# Patient Record
Sex: Male | Born: 1940 | Race: White | Hispanic: No | Marital: Married | Smoking: Never smoker
Health system: Southern US, Community
[De-identification: ages and names within clinical notes are randomized; demographics above are authoritative.]

## PROBLEM LIST (undated history)

## (undated) DIAGNOSIS — E039 Hypothyroidism, unspecified: Secondary | ICD-10-CM

## (undated) DIAGNOSIS — I495 Sick sinus syndrome: Secondary | ICD-10-CM

## (undated) DIAGNOSIS — I480 Paroxysmal atrial fibrillation: Secondary | ICD-10-CM

## (undated) DIAGNOSIS — Z950 Presence of cardiac pacemaker: Secondary | ICD-10-CM

## (undated) DIAGNOSIS — Z9049 Acquired absence of other specified parts of digestive tract: Secondary | ICD-10-CM

## (undated) HISTORY — PX: APPENDECTOMY: SHX54

## (undated) HISTORY — DX: Presence of cardiac pacemaker: Z95.0

## (undated) HISTORY — DX: Acquired absence of other specified parts of digestive tract: Z90.49

## (undated) HISTORY — DX: Sick sinus syndrome: I49.5

## (undated) HISTORY — PX: TONSILLECTOMY: SUR1361

## (undated) HISTORY — PX: ACHILLES TENDON REPAIR: SUR1153

## (undated) HISTORY — PX: COLON SURGERY: SHX602

## (undated) HISTORY — DX: Paroxysmal atrial fibrillation: I48.0

## (undated) HISTORY — PX: PACEMAKER IMPLANT: EP1218

---

## 2019-08-18 ENCOUNTER — Emergency Department (HOSPITAL_COMMUNITY): Payer: Medicare Other

## 2019-08-18 ENCOUNTER — Emergency Department (HOSPITAL_COMMUNITY)
Admission: EM | Admit: 2019-08-18 | Discharge: 2019-08-18 | Disposition: A | Discharge status: Home / Self Care | Payer: Medicare Other | Attending: Emergency Medicine | Admitting: Emergency Medicine

## 2019-08-18 ENCOUNTER — Other Ambulatory Visit: Payer: Self-pay

## 2019-08-18 ENCOUNTER — Encounter (HOSPITAL_COMMUNITY): Payer: Self-pay | Admitting: *Deleted

## 2019-08-18 DIAGNOSIS — I480 Paroxysmal atrial fibrillation: Secondary | ICD-10-CM | POA: Diagnosis not present

## 2019-08-18 DIAGNOSIS — R0602 Shortness of breath: Secondary | ICD-10-CM | POA: Diagnosis present

## 2019-08-18 DIAGNOSIS — U071 COVID-19: Secondary | ICD-10-CM

## 2019-08-18 DIAGNOSIS — E039 Hypothyroidism, unspecified: Secondary | ICD-10-CM | POA: Diagnosis not present

## 2019-08-18 DIAGNOSIS — E860 Dehydration: Secondary | ICD-10-CM

## 2019-08-18 HISTORY — DX: Hypothyroidism, unspecified: E03.9

## 2019-08-18 LAB — CBC WITH DIFFERENTIAL/PLATELET
Abs Immature Granulocytes: 0.02 10*3/uL (ref 0.00–0.07)
Basophils Absolute: 0 10*3/uL (ref 0.0–0.1)
Basophils Relative: 0 %
Eosinophils Absolute: 0 10*3/uL (ref 0.0–0.5)
Eosinophils Relative: 0 %
HCT: 52.6 % — ABNORMAL HIGH (ref 39.0–52.0)
Hemoglobin: 17.6 g/dL — ABNORMAL HIGH (ref 13.0–17.0)
Immature Granulocytes: 0 %
Lymphocytes Relative: 17 %
Lymphs Abs: 1.1 10*3/uL (ref 0.7–4.0)
MCH: 30.8 pg (ref 26.0–34.0)
MCHC: 33.5 g/dL (ref 30.0–36.0)
MCV: 92 fL (ref 80.0–100.0)
Monocytes Absolute: 0.8 10*3/uL (ref 0.1–1.0)
Monocytes Relative: 12 %
Neutro Abs: 4.5 10*3/uL (ref 1.7–7.7)
Neutrophils Relative %: 71 %
Platelets: 127 10*3/uL — ABNORMAL LOW (ref 150–400)
RBC: 5.72 MIL/uL (ref 4.22–5.81)
RDW: 12.2 % (ref 11.5–15.5)
WBC: 6.3 10*3/uL (ref 4.0–10.5)
nRBC: 0 % (ref 0.0–0.2)

## 2019-08-18 LAB — COMPREHENSIVE METABOLIC PANEL
ALT: 23 U/L (ref 0–44)
AST: 29 U/L (ref 15–41)
Albumin: 4.4 g/dL (ref 3.5–5.0)
Alkaline Phosphatase: 67 U/L (ref 38–126)
Anion gap: 11 (ref 5–15)
BUN: 24 mg/dL — ABNORMAL HIGH (ref 8–23)
CO2: 24 mmol/L (ref 22–32)
Calcium: 8.8 mg/dL — ABNORMAL LOW (ref 8.9–10.3)
Chloride: 97 mmol/L — ABNORMAL LOW (ref 98–111)
Creatinine, Ser: 1.54 mg/dL — ABNORMAL HIGH (ref 0.61–1.24)
GFR calc Af Amer: 50 mL/min — ABNORMAL LOW (ref >60)
GFR calc non Af Amer: 43 mL/min — ABNORMAL LOW (ref >60)
Glucose, Bld: 119 mg/dL — ABNORMAL HIGH (ref 70–99)
Potassium: 4.5 mmol/L (ref 3.5–5.1)
Sodium: 132 mmol/L — ABNORMAL LOW (ref 135–145)
Total Bilirubin: 1.3 mg/dL — ABNORMAL HIGH (ref 0.3–1.2)
Total Protein: 8.5 g/dL — ABNORMAL HIGH (ref 6.5–8.1)

## 2019-08-18 LAB — POC SARS CORONAVIRUS 2 AG -  ED: SARS Coronavirus 2 Ag: POSITIVE — AB

## 2019-08-18 LAB — BRAIN NATRIURETIC PEPTIDE: B Natriuretic Peptide: 378 pg/mL — ABNORMAL HIGH (ref 0.0–100.0)

## 2019-08-18 LAB — TROPONIN I (HIGH SENSITIVITY)
Troponin I (High Sensitivity): 5 ng/L (ref <18)
Troponin I (High Sensitivity): 6 ng/L (ref <18)

## 2019-08-18 MED ORDER — SODIUM CHLORIDE 0.9 % IV BOLUS
1000.0000 mL | Freq: Once | INTRAVENOUS | Status: AC
  Administered 2019-08-18: 1000 mL via INTRAVENOUS

## 2019-08-18 MED ORDER — ALBUTEROL SULFATE (2.5 MG/3ML) 0.083% IN NEBU: 5.0000 mg | INHALATION_SOLUTION | Freq: Once | RESPIRATORY_TRACT | Status: DC

## 2019-08-18 MED ORDER — SODIUM CHLORIDE 0.9 % IV BOLUS
500.0000 mL | Freq: Once | INTRAVENOUS | Status: AC
  Administered 2019-08-18: 500 mL via INTRAVENOUS

## 2019-08-18 NOTE — Discharge Instructions (Addendum)
Continue medications as previously prescribed.  Drink plenty of fluids and get plenty of rest.  Isolate yourself and remain at home until symptoms resolve +1-week.  Return to the emergency department if you develop worsening breathing, severe chest pain, or other new and concerning symptoms.       Infection Prevention Recommendations for Individuals Confirmed to have, or Being Evaluated for, 2019 Novel Coronavirus (COVID-19) Infection Who Receive Care at Home  Individuals who are confirmed to have, or are being evaluated for, COVID-19 should follow the prevention steps below until a healthcare provider or local or state health department says they can return to normal activities.  Stay home except to get medical care You should restrict activities outside your home, except for getting medical care. Do not go to work, school, or public areas, and do not use public transportation or taxis.  Call ahead before visiting your doctor Before your medical appointment, call the healthcare provider and tell them that you have, or are being evaluated for, COVID-19 infection. This will help the healthcare providers office take steps to keep other people from getting infected. Ask your healthcare provider to call the local or state health department.  Monitor your symptoms Seek prompt medical attention if your illness is worsening (e.g., difficulty breathing). Before going to your medical appointment, call the healthcare provider and tell them that you have, or are being evaluated for, COVID-19 infection. Ask your healthcare provider to call the local or state health department.  Wear a facemask You should wear a facemask that covers your nose and mouth when you are in the same room with other people and when you visit a healthcare provider. People who live with or visit you should also wear a facemask while they are in the same room with you.  Separate yourself from other people in your  home As much as possible, you should stay in a different room from other people in your home. Also, you should use a separate bathroom, if available.  Avoid sharing household items You should not share dishes, drinking glasses, cups, eating utensils, towels, bedding, or other items with other people in your home. After using these items, you should wash them thoroughly with soap and water.  Cover your coughs and sneezes Cover your mouth and nose with a tissue when you cough or sneeze, or you can cough or sneeze into your sleeve. Throw used tissues in a lined trash can, and immediately wash your hands with soap and water for at least 20 seconds or use an alcohol-based hand rub.  Wash your [facility] your hands often and thoroughly with soap and water for at least 20 seconds. You can use an alcohol-based hand sanitizer if soap and water are not available and if your hands are not visibly dirty. Avoid touching your eyes, nose, and mouth with unwashed hands.   Prevention Steps for Caregivers and Household Members of Individuals Confirmed to have, or Being Evaluated for, COVID-19 Infection Being Cared for in the Home  If you live with, or provide care at home for, a person confirmed to have, or being evaluated for, COVID-19 infection please follow these guidelines to prevent infection:  Follow healthcare providers instructions Make sure that you understand and can help the patient follow any healthcare provider instructions for all care.  Provide for the patients basic needs You should help the patient with basic needs in the home and provide support for getting groceries, prescriptions, and other personal needs.  Monitor the patients  symptoms If they are getting sicker, call his or her medical provider and tell them that the patient has, or is being evaluated for, COVID-19 infection. This will help the healthcare providers office take steps to keep other people from getting  infected. Ask the healthcare provider to call the local or state health department.  Limit the number of people who have contact with the patient If possible, have only one caregiver for the patient. Other household members should stay in another home or place of residence. If this is not possible, they should stay in another room, or be separated from the patient as much as possible. Use a separate bathroom, if available. Restrict visitors who do not have an essential need to be in the home.  Keep older adults, very young children, and other sick people away from the patient Keep older adults, very young children, and those who have compromised immune systems or chronic health conditions away from the patient. This includes people with chronic heart, lung, or kidney conditions, diabetes, and cancer.  Ensure good ventilation Make sure that shared spaces in the home have good air flow, such as from an air conditioner or an opened window, weather permitting.  Wash your hands often Wash your hands often and thoroughly with soap and water for at least 20 seconds. You can use an alcohol based hand sanitizer if soap and water are not available and if your hands are not visibly dirty. Avoid touching your eyes, nose, and mouth with unwashed hands. Use disposable paper towels to dry your hands. If not available, use dedicated cloth towels and replace them when they become wet.  Wear a facemask and gloves Wear a disposable facemask at all times in the room and gloves when you touch or have contact with the patients blood, body fluids, and/or secretions or excretions, such as sweat, saliva, sputum, nasal mucus, vomit, urine, or feces.  Ensure the mask fits over your nose and mouth tightly, and do not touch it during use. Throw out disposable facemasks and gloves after using them. Do not reuse. Wash your hands immediately after removing your facemask and gloves. If your personal clothing becomes  contaminated, carefully remove clothing and launder. Wash your hands after handling contaminated clothing. Place all used disposable facemasks, gloves, and other waste in a lined container before disposing them with other household waste. Remove gloves and wash your hands immediately after handling these items.  Do not share dishes, glasses, or other household items with the patient Avoid sharing household items. You should not share dishes, drinking glasses, cups, eating utensils, towels, bedding, or other items with a patient who is confirmed to have, or being evaluated for, COVID-19 infection. After the person uses these items, you should wash them thoroughly with soap and water.  Wash laundry thoroughly Immediately remove and wash clothes or bedding that have blood, body fluids, and/or secretions or excretions, such as sweat, saliva, sputum, nasal mucus, vomit, urine, or feces, on them. Wear gloves when handling laundry from the patient. Read and follow directions on labels of laundry or clothing items and detergent. In general, wash and dry with the warmest temperatures recommended on the label.  Clean all areas the individual has used often Clean all touchable surfaces, such as counters, tabletops, doorknobs, bathroom fixtures, toilets, phones, keyboards, tablets, and bedside tables, every day. Also, clean any surfaces that may have blood, body fluids, and/or secretions or excretions on them. Wear gloves when cleaning surfaces the patient has come in contact  with. Use a diluted bleach solution (e.g., dilute bleach with 1 part bleach and 10 parts water) or a household disinfectant with a label that says EPA-registered for coronaviruses. To make a bleach solution at home, add 1 tablespoon of bleach to 1 quart (4 cups) of water. For a larger supply, add  cup of bleach to 1 gallon (16 cups) of water. Read labels of cleaning products and follow recommendations provided on product labels. Labels  contain instructions for safe and effective use of the cleaning product including precautions you should take when applying the product, such as wearing gloves or eye protection and making sure you have good ventilation during use of the product. Remove gloves and wash hands immediately after cleaning.  Monitor yourself for signs and symptoms of illness Caregivers and household members are considered close contacts, should monitor their health, and will be asked to limit movement outside of the home to the extent possible. Follow the monitoring steps for close contacts listed on the symptom monitoring form.   ? If you have additional questions, contact your local health department or call the epidemiologist on call at [phone] (available 24/7). ? This guidance is subject to change. For the most up-to-date guidance from [facility], please refer to their website: [email or web address]

## 2019-08-18 NOTE — ED Notes (Signed)
Date and time results received: 08/18/19 1945 (use smartphrase ".now" to insert current time)  Test: POC covid Critical Value: positive  Name of Provider Notified: Dr Stark Jock  Orders Received? Or Actions Taken?: Actions Taken: no orders received

## 2019-08-18 NOTE — ED Triage Notes (Signed)
C/o shortness of breath, loss of appetite and weakness onset 1 week ago, thinks he may have covid

## 2019-08-18 NOTE — ED Notes (Signed)
Walk in room 02 97% room air

## 2019-08-18 NOTE — ED Provider Notes (Signed)
[facility] EMERGENCY DEPARTMENT Provider Note   CSN: [id number] Arrival date & time: 08/18/19  1806     History Chief Complaint  Patient presents with  . Shortness of Breath    Antonio Curtis is a 78 y.o. male.  Patient is a 78 year old male with past medical history of atrial fibrillation on Eliquis, sick sinus with pacemaker placement, hypothyroidism.  He presents today for evaluation of shortness of breath.  Patient reports weakness and fatigue worsening over the past week, then became dyspneic this evening with ambulation.  He does report some cough, but no fevers or chills.  Patient does report some palpitations.  The history is provided by the patient.  Shortness of Breath Severity:  Moderate Onset quality:  Gradual Duration:  1 week Timing:  Constant Progression:  Worsening Chronicity:  New Context: activity and URI   Relieved by:  Nothing Worsened by:  Activity Ineffective treatments:  None tried Associated symptoms: cough   Associated symptoms: no chest pain and no fever        Past Medical History:  Diagnosis Date  . A-fib ([place])   . Hypothyroid     There are no problems to display for this patient.   Past Surgical History:  Procedure Laterality Date  . APPENDECTOMY    . COLON SURGERY    . TONSILLECTOMY         No family history on file.  Social History   Tobacco Use  . Smoking status: Never Smoker  . Smokeless tobacco: Never Used  Substance Use Topics  . Alcohol use: Never  . Drug use: Not Currently    Home Medications Prior to Admission medications   Not on File    Allergies    Patient has no known allergies.  Review of Systems   Review of Systems  Constitutional: Negative for fever.  Respiratory: Positive for cough and shortness of breath.   Cardiovascular: Negative for chest pain.  All other systems reviewed and are negative.   Physical Exam Updated Vital Signs BP 92/69 (BP Location: Right Arm)   Pulse (!) 105    Temp 97.8 F (36.6 C) (Oral)   Resp 18   Ht 6' (1.829 m)   SpO2 98%   Physical Exam Vitals and nursing note reviewed.  Constitutional:      General: He is not in acute distress.    Appearance: He is well-developed. He is not diaphoretic.  HENT:     Head: Normocephalic and atraumatic.  Cardiovascular:     Rate and Rhythm: Tachycardia present. Rhythm irregular.     Heart sounds: No murmur. No friction rub.  Pulmonary:     Effort: Pulmonary effort is normal. No respiratory distress.     Breath sounds: Normal breath sounds. No wheezing or rales.  Abdominal:     General: Bowel sounds are normal. There is no distension.     Palpations: Abdomen is soft.     Tenderness: There is no abdominal tenderness.  Musculoskeletal:        General: Normal range of motion.     Cervical back: Normal range of motion and neck supple.     Right lower leg: No tenderness. No edema.     Left lower leg: No tenderness. No edema.  Skin:    General: Skin is warm and dry.  Neurological:     Mental Status: He is alert and oriented to person, place, and time.     Coordination: Coordination normal.     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  Procedures Procedures (including critical care time)  Medications Ordered in  ED Medications  albuterol (PROVENTIL) (2.5 MG/3ML) 0.083% nebulizer solution 5 mg (5 mg Nebulization Not Given 08/18/19 1826)  sodium chloride 0.9 % bolus 1,000 mL (has no administration in time range)    ED Course  I have reviewed the triage vital signs and the nursing notes.  Pertinent labs & imaging results that were available during my care of the patient were reviewed by me and considered in my medical decision making (see chart for details).  Patient is a 78 year old male with history of paroxysmal A. fib, pacemaker placement, and hypothyroidism.  He presents today for evaluation of weakness, shortness of breath, and fatigue.  This is worsened over the past week.  Patient's son is a hospitalist who checked on him.  His oxygen saturations were borderline low and brings him here for evaluation.  Initial blood pressure is somewhat soft with systolic in the 90s, but no fever or white count.  Patient hydrated with normal saline with some improvement.  He is also in atrial fibrillation with controlled ventricular rate.  This is not uncommon for him and he tells me this comes and goes.  He is currently taking flecainide and metoprolol.  Work-up shows a positive Covid test.  His chest x-ray is clear and oxygen saturations have been 95% or greater throughout his emergency department stay.  Electrolytes are reflective of some degree of dehydration for which he was given 1500 cc of normal saline.  Patient ambulated in his exam room and tolerated this well.   At this point, the disposition was discussed with the patient and son.  They are both comfortable with him returning home.  He is not hypoxic and appears clinically well.  He will be discharged to home, to return as needed if he develops worsening breathing, chest pains, or other new and concerning symptoms.  MDM Rules/Calculators/A&P  Final Clinical Impression(s) / ED Diagnoses Final diagnoses:  None    Rx / DC Orders ED Discharge Orders     None       Geoffery Lyons, MD 08/18/19 2242

## 2019-08-19 ENCOUNTER — Telehealth (HOSPITAL_COMMUNITY): Payer: Self-pay | Admitting: Critical Care Medicine

## 2019-08-19 ENCOUNTER — Ambulatory Visit (HOSPITAL_COMMUNITY)
Admission: RE | Admit: 2019-08-19 | Discharge: 2019-08-19 | Disposition: A | Discharge status: Home / Self Care | Payer: Medicare Other | Source: Ambulatory Visit | Attending: Pulmonary Disease | Admitting: Pulmonary Disease

## 2019-08-19 ENCOUNTER — Other Ambulatory Visit (HOSPITAL_COMMUNITY): Payer: Self-pay | Admitting: Critical Care Medicine

## 2019-08-19 DIAGNOSIS — I4811 Longstanding persistent atrial fibrillation: Secondary | ICD-10-CM | POA: Insufficient documentation

## 2019-08-19 DIAGNOSIS — U071 COVID-19: Secondary | ICD-10-CM | POA: Insufficient documentation

## 2019-08-19 DIAGNOSIS — Z23 Encounter for immunization: Secondary | ICD-10-CM | POA: Diagnosis not present

## 2019-08-19 MED ORDER — ALBUTEROL SULFATE HFA 108 (90 BASE) MCG/ACT IN AERS: 2.0000 | INHALATION_SPRAY | Freq: Once | RESPIRATORY_TRACT | Status: DC | PRN

## 2019-08-19 MED ORDER — SODIUM CHLORIDE 0.9 % IV SOLN
Freq: Once | INTRAVENOUS | Status: AC
  Filled 2019-08-19: qty 10

## 2019-08-19 MED ORDER — METHYLPREDNISOLONE SODIUM SUCC 125 MG IJ SOLR: 125.0000 mg | Freq: Once | INTRAMUSCULAR | Status: DC | PRN

## 2019-08-19 MED ORDER — FAMOTIDINE IN NACL 20-0.9 MG/50ML-% IV SOLN: 20.0000 mg | Freq: Once | INTRAVENOUS | Status: DC | PRN

## 2019-08-19 MED ORDER — EPINEPHRINE 0.3 MG/0.3ML IJ SOAJ: 0.3000 mg | Freq: Once | INTRAMUSCULAR | Status: DC | PRN

## 2019-08-19 MED ORDER — DIPHENHYDRAMINE HCL 50 MG/ML IJ SOLN: 50.0000 mg | Freq: Once | INTRAMUSCULAR | Status: DC | PRN

## 2019-08-19 MED ORDER — SODIUM CHLORIDE 0.9 % IV SOLN
INTRAVENOUS | Status: DC | PRN
  Administered 2019-08-19: 13:00:00 250 mL via INTRAVENOUS

## 2019-08-19 NOTE — Telephone Encounter (Signed)
I connected with this patient who is Covid positive and actually was in the emergency room last night with dehydration and rapid heart rate from his chronic atrial fibrillation and volume depletion.  The patient has cardiovascular disease and is over 78 years of age.  The patient today is having some cough shortness of breath severe fatigue body aches but is improved somewhat from getting fluid hydration last night in the emergency room  The patient has agreed to monoclonal antibody infusion  Called to discuss with patient about Covid symptoms and the use of  casirivimab\imdevimab. , a monoclonal antibody infusion for those with mild to moderate Covid symptoms and at a high risk of hospitalization.  Pt is qualified for this infusion at the [facility] infusion center due to Age > 65, cardiovascular disease: chronic atrial fibrillation

## 2019-08-19 NOTE — Discharge Instructions (Signed)
Prevent the Spread of COVID-19 if You Are Sick If you are sick with COVID-19 or think you might have COVID-19, follow the steps below to help protect other people in your home and community. Stay home except to get medical care.  Stay home. Most people with COVID-19 have mild illness and are able to recover at home without medical care. Do not leave your home, except to get medical care. Do not visit public areas.  Take care of yourself. Get rest and stay hydrated.  Get medical care when needed. Call your doctor before you go to their office for care. But, if you have trouble breathing or other concerning symptoms, call 911 for immediate help.  Avoid public transportation, ride-sharing, or taxis. Separate yourself from other people and pets in your home.  As much as possible, stay in a specific room and away from other people and pets in your home. Also, you should use a separate bathroom, if available. If you need to be around other people or animals in or outside of the home, wear a cloth face covering. ? See COVID-19 and Animals if you have questions about pets: https://www.cdc.gov/coronavirus/2019-ncov/faq.html#COVID19animals Monitor your symptoms.  Common symptoms of COVID-19 include fever and cough. Trouble breathing is a more serious symptom that means you should get medical attention.  Follow care instructions from your healthcare provider and local health department. Your local health authorities will give instructions on checking your symptoms and reporting information. If you develop emergency warning signs for COVID-19 get medical attention immediately.  Emergency warning signs include*:  Trouble breathing  Persistent pain or pressure in the chest  New confusion or not able to be woken  Bluish lips or face *This list is not all inclusive. Please consult your medical provider for any other symptoms that are severe or concerning to you. Call 911 if you have a medical  emergency. If you have a medical emergency and need to call 911, notify the operator that you have or think you might have, COVID-19. If possible, put on a facemask before medical help arrives. Call ahead before visiting your doctor.  Call ahead. Many medical visits for routine care are being postponed or done by phone or telemedicine.  If you have a medical appointment that cannot be postponed, call your doctor's office. This will help the office protect themselves and other patients. If you are sick, wear a cloth covering over your nose and mouth.  You should wear a cloth face covering over your nose and mouth if you must be around other people or animals, including pets (even at home).  You don't need to wear the cloth face covering if you are alone. If you can't put on a cloth face covering (because of trouble breathing for example), cover your coughs and sneezes in some other way. Try to stay at least 6 feet away from other people. This will help protect the people around you. Note: During the COVID-19 pandemic, medical grade facemasks are reserved for healthcare workers and some first responders. You may need to make a cloth face covering using a scarf or bandana. Cover your coughs and sneezes.  Cover your mouth and nose with a tissue when you cough or sneeze.  Throw used tissues in a lined trash can.  Immediately wash your hands with soap and water for at least 20 seconds. If soap and water are not available, clean your hands with an alcohol-based hand sanitizer that contains at least 60% alcohol. Clean your hands often.    Wash your hands often with soap and water for at least 20 seconds. This is especially important after blowing your nose, coughing, or sneezing; going to the bathroom; and before eating or preparing food.  Use hand sanitizer if soap and water are not available. Use an alcohol-based hand sanitizer with at least 60% alcohol, covering all surfaces of your hands and rubbing  them together until they feel dry.  Soap and water are the best option, especially if your hands are visibly dirty.  Avoid touching your eyes, nose, and mouth with unwashed hands. Avoid sharing personal household items.  Do not share dishes, drinking glasses, cups, eating utensils, towels, or bedding with other people in your home.  Wash these items thoroughly after using them with soap and water or put them in the dishwasher. Clean all "high-touch" surfaces everyday.  Clean and disinfect high-touch surfaces in your "sick room" and bathroom. Let someone else clean and disinfect surfaces in common areas, but not your bedroom and bathroom.  If a caregiver or other person needs to clean and disinfect a sick person's bedroom or bathroom, they should do so on an as-needed basis. The caregiver/other person should wear a mask and wait as long as possible after the sick person has used the bathroom. High-touch surfaces include phones, remote controls, counters, tabletops, doorknobs, bathroom fixtures, toilets, keyboards, tablets, and bedside tables.  Clean and disinfect areas that may have blood, stool, or body fluids on them.  Use household cleaners and disinfectants. Clean the area or item with soap and water or another detergent if it is dirty. Then use a household disinfectant. ? Be sure to follow the instructions on the label to ensure safe and effective use of the product. Many products recommend keeping the surface wet for several minutes to ensure germs are killed. Many also recommend precautions such as wearing gloves and making sure you have good ventilation during use of the product. ? Most EPA-registered household disinfectants should be effective. How to discontinue home isolation  People with COVID-19 who have stayed home (home isolated) can stop home isolation under the following conditions: ? If you will not have a test to determine if you are still contagious, you can leave home  after these three things have happened:  You have had no fever for at least 72 hours (that is three full days of no fever without the use of medicine that reduces fevers) AND  other symptoms have improved (for example, when your cough or shortness of breath has improved) AND  at least 10 days have passed since your symptoms first appeared. ? If you will be tested to determine if you are still contagious, you can leave home after these three things have happened:  You no longer have a fever (without the use of medicine that reduces fevers) AND  other symptoms have improved (for example, when your cough or shortness of breath has improved) AND  you received two negative tests in a row, 24 hours apart. Your doctor will follow CDC guidelines. In all cases, follow the guidance of your healthcare provider and local health department. The decision to stop home isolation should be made in consultation with your healthcare provider and state and local health departments. Local decisions depend on local circumstances. cdc.gov/coronavirus 01/04/2019 This information is not intended to replace advice given to you by your health care provider. Make sure you discuss any questions you have with your health care provider. Document Released: 12/16/2018 Document Revised: 01/14/2019 Document Reviewed: 12/16/2018   Elsevier Patient Education  2020 [employer]  

## 2019-08-19 NOTE — Progress Notes (Signed)
  Diagnosis: COVID-19  Physician: Dr. Joya Gaskins  Procedure: Covid Infusion Clinic Med: casirivimab\imdevimab infusion - Provided patient with casirivimab\imdevimab fact sheet for patients, parents and caregivers prior to infusion.  Complications: No immediate complications noted.  Discharge: Discharged home   Tai, Hermelinda Medicus 08/19/2019

## 2019-08-19 NOTE — Progress Notes (Signed)
  Diagnosis: COVID-19  Physician:Wright   Procedure: Covid Infusion Clinic Med: casirivimab\imdevimab infusion - Provided patient with casirivimab\imdevimab fact sheet for patients, parents and caregivers prior to infusion.  Complications: No immediate complications noted.  Discharge: Discharged home   Baxter Hire 08/19/2019

## 2019-08-19 NOTE — Progress Notes (Signed)
  I connected by phone with [name] on 08/19/2019 at 7:34 AM to discuss the potential use of an new treatment for mild to moderate COVID-19 viral infection in non-hospitalized patients.  This patient is a 78 y.o. male that meets the FDA criteria for Emergency Use Authorization of bamlanivimab or casirivimab\imdevimab.  Has a (+) direct SARS-CoV-2 viral test result  Has mild or moderate COVID-19   Is ? 76 years of age and weighs ? 40 kg  Is NOT hospitalized due to COVID-19  Is NOT requiring oxygen therapy or requiring an increase in baseline oxygen flow rate due to COVID-19  Is within 10 days of symptom onset  Has at least one of the high risk factor(s) for progression to severe COVID-19 and/or hospitalization as defined in EUA.  Specific high risk criteria : Cardiovascular Disease Age> 65   I have spoken and communicated the following to the patient or parent/caregiver:  1. FDA has authorized the emergency use of bamlanivimab and casirivimab\imdevimab for the treatment of mild to moderate COVID-19 in adults and pediatric patients with positive results of direct SARS-CoV-2 viral testing who are 13 years of age and older weighing at least 40 kg, and who are at high risk for progressing to severe COVID-19 and/or hospitalization.  2. The significant known and potential risks and benefits of bamlanivimab and casirivimab\imdevimab, and the extent to which such potential risks and benefits are unknown.  3. Information on available alternative treatments and the risks and benefits of those alternatives, including clinical trials.  4. Patients treated with bamlanivimab and casirivimab\imdevimab should continue to self-isolate and use infection control measures (e.g., wear mask, isolate, social distance, avoid sharing personal items, clean and disinfect "high touch" surfaces, and frequent handwashing) according to CDC guidelines.   5. The patient or parent/caregiver has the option to  accept or refuse bamlanivimab or casirivimab\imdevimab .  After reviewing this information with the patient, The patient agreed to proceed with receiving the casirivimab\imdevimab infusion and will be provided a copy of the Fact sheet prior to receiving the infusion.[name] 08/19/2019 7:34 AM

## 2019-09-15 ENCOUNTER — Telehealth: Payer: Self-pay | Admitting: *Deleted

## 2019-09-15 NOTE — Telephone Encounter (Signed)
-----   Message from Rollene Rotunda, MD sent at 09/15/2019 10:59 AM EST ----- Hi,  Can we get this patient into see me as a new on Thursday.  His son is a hospitalist?  Thanks.

## 2019-09-15 NOTE — Telephone Encounter (Signed)
Spoke with patient and advised of appointment, confirmed location

## 2019-09-15 NOTE — Telephone Encounter (Signed)
Patient scheduled for Thursday, Left message to call back

## 2019-09-16 DIAGNOSIS — Z7189 Other specified counseling: Secondary | ICD-10-CM | POA: Insufficient documentation

## 2019-09-16 DIAGNOSIS — I4891 Unspecified atrial fibrillation: Secondary | ICD-10-CM | POA: Insufficient documentation

## 2019-09-16 NOTE — Progress Notes (Signed)
Cardiology Office Note   Date:  09/17/2019   ID:  Antonio Curtis, DOB 1941/04/25, MRN [id number]  PCP:  Josetta Huddle, MD Cardiologist:   No primary care provider on file. Referring:  Josetta Huddle, MD  Chief Complaint  Patient presents with  . Atrial Fibrillation      History of Present Illness: Antonio Curtis is a 79 y.o. male who is referred by Josetta Huddle, MDfor  evaluation of atrial fib.  He has moved here from [place].  He has had atrial fib for 5 - 6 years.  He has been treated with anticoagulation and has had PAF.  He has more recently been treated with flecainide.  However, he is not sure that this has helped.  He has paroxysms a few times per month although none since Dec.  He was in afib when he was found to have COVID last month.  He feels the fib and he gets very fatigued with this.  He has to rest and cannot do his usual activity. He feels the irregular heart beat.  He does not feel presyncope or syncope.  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  He has had no presyncope or syncope since then.  He is physically active.  The patient denies any new symptoms such as chest discomfort, neck or arm discomfort. There has been no new shortness of breath, PND or orthopnea.   Of note he had a negative stress test per his report a year ago and an echo years ago.  I don't have those records.    Past Medical History:  Diagnosis Date  . A-fib ([place])   . Hypothyroid   . 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 Sick sinus syndrome [facility])     Past Surgical History:  Procedure Laterality Date  . ACHILLES TENDON REPAIR    . APPENDECTOMY    . COLON SURGERY     Hemicolectomy:  Polyp.    . TONSILLECTOMY       Current Outpatient Medications  Medication Sig Dispense Refill  . apixaban (ELIQUIS) 5 MG TABS tablet Take 5 mg by mouth 2 (two) times daily.    . Cholecalciferol (VITAMIN D3) 50 MCG (2000 UT) TABS Take by  mouth.    . flecainide (TAMBOCOR) 100 MG tablet Take 100 mg by mouth 2 (two) times daily.    Marland Kitchen levothyroxine (SYNTHROID) 50 MCG tablet Take 50 mcg by mouth daily before breakfast.    . metoprolol tartrate 37.5 MG TABS Take 37.5 mg by mouth 2 (two) times daily. 180 tablet 3  . guaiFENesin (MUCINEX) 600 MG 12 hr tablet Take 600 mg by mouth 2 (two) times daily as needed for cough or to loosen phlegm.     No current facility-administered medications for this visit.    Allergies:   Patient has no known allergies.    Social History:  The patient  reports that he has never smoked. He has never used smokeless tobacco. He reports previous drug use. He reports that he does not drink alcohol.   Family History:  The patient's family history includes Diabetes in his father; Heart disease (age of onset: 40) in his father.    ROS:  Please see the history of present illness.   Otherwise, review of systems are positive for none.   All other systems are reviewed and negative.    PHYSICAL EXAM: VS:  BP 129/72   Pulse 80   Temp (!) 96.9 F (36.1 C)   Ht 6' (1.829 m)   Wt 182 lb 6.4 oz (82.7 kg)   SpO2 99%   BMI 24.74 kg/m  , BMI Body mass index is 24.74 kg/m. GENERAL:  Well appearing HEENT:  Pupils equal round and reactive, fundi not visualized, oral mucosa unremarkable NECK:  No jugular venous distention, waveform within normal limits, carotid upstroke brisk and symmetric, no bruits, no thyromegaly LYMPHATICS:  No cervical, inguinal adenopathy LUNGS:  Clear to auscultation bilaterally BACK:  No CVA tenderness CHEST:  Well healed pacer pocket HEART:  PMI not displaced or sustained,S1 and S2 within normal limits, no S3, no clicks, no rubs, no murmurs, irregular ABD:  Flat, positive bowel sounds normal in frequency in pitch, no bruits, no rebound, no guarding, no midline pulsatile mass, no hepatomegaly, no splenomegaly EXT:  2 plus pulses throughout, no edema, no cyanosis no clubbing SKIN:  No  rashes no nodules NEURO:  Cranial nerves II through XII grossly intact, motor grossly intact throughout PSYCH:  Cognitively intact, oriented to person place and time    EKG:  EKG is ordered today. The ekg ordered today demonstrates Paced atrial rhythm with first degree AV block, rate 80.  No acute ST T wave changes.    Recent Labs: 08/18/2019: ALT 23; B Natriuretic Peptide 378.0; BUN 24; Creatinine, Ser 1.54; Hemoglobin 17.6; Platelets 127; Potassium 4.5; Sodium 132    Lipid Panel No results found for: CHOL, TRIG, HDL, CHOLHDL, VLDL, LDLCALC, LDLDIRECT    Wt Readings from Last 3 Encounters:  09/17/19 182 lb 6.4 oz (82.7 kg)      Other studies Reviewed: Additional studies/ records that were reviewed today include: Office records. Review of the above records demonstrates:  Please see elsewhere in the note.     ASSESSMENT AND PLAN:  ATRIAL FIB:  He tolerates anticoagulation.  Mr. Clance Baquero has a CHA2DS2 - VASc score of 2.  I think he is not doing as hoped with flecainide.  However, his QT is prolonged and so he cannot get Tikosyn.  I would like to try to avoid amiodarone.  I will increase his beta blocker slightly.    SICK SINUS SYNDROME/PACEMAKER POCKET:  I am going to get him into the EP clinic.  I would like to see how often he has atrial fib and for how long as above.    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  He and I began to talk about a sleep study but I will hold off on this for now.    COVID EDUCATION:  He can delay the vaccine for 90 days post his recent infection.    Current medicines are reviewed at length with the patient today.  The patient does not have concerns regarding medicines.  The following changes have been made:  As above  Labs/ tests ordered today include: None  Orders Placed This Encounter  Procedures  . Ambulatory referral to Cardiac Electrophysiology  . EKG 12-Lead     Disposition:   FU with me in 3 months.      Signed, Rollene Rotunda, MD  09/17/2019 5:56 PM    [facility]

## 2019-09-17 ENCOUNTER — Encounter (INDEPENDENT_AMBULATORY_CARE_PROVIDER_SITE_OTHER): Payer: Self-pay

## 2019-09-17 ENCOUNTER — Ambulatory Visit (INDEPENDENT_AMBULATORY_CARE_PROVIDER_SITE_OTHER): Payer: Medicare Other | Admitting: Cardiology

## 2019-09-17 ENCOUNTER — Other Ambulatory Visit: Payer: Self-pay

## 2019-09-17 ENCOUNTER — Encounter: Payer: Self-pay | Admitting: Cardiology

## 2019-09-17 VITALS — BP 129/72 | HR 80 | Temp 96.9°F | Ht 72.0 in | Wt 182.4 lb

## 2019-09-17 DIAGNOSIS — Z950 Presence of cardiac pacemaker: Secondary | ICD-10-CM

## 2019-09-17 DIAGNOSIS — I4891 Unspecified atrial fibrillation: Secondary | ICD-10-CM

## 2019-09-17 DIAGNOSIS — Z7189 Other specified counseling: Secondary | ICD-10-CM

## 2019-09-17 MED ORDER — METOPROLOL TARTRATE 37.5 MG PO TABS: 37.5000 mg | ORAL_TABLET | Freq: Two times a day (BID) | ORAL | 3 refills | Status: DC

## 2019-09-17 NOTE — Patient Instructions (Signed)
Medication Instructions:  INCREASE METOPROLOL TO 37.5MG  TWICE A DAY *If you need a refill on your cardiac medications before your next appointment, please call your pharmacy*  Lab Work: NONE If you have labs (blood work) drawn today and your tests are completely normal, you will receive your results only by: Marland Kitchen MyChart Message (if you have MyChart) OR . A paper copy in the mail If you have any lab test that is abnormal or we need to change your treatment, we will call you to review the results.  Testing/Procedures: NONE  Follow-Up: At [facility], you and your health needs are our priority.  As part of our continuing mission to provide you with exceptional heart care, we have created designated Provider Care Teams.  These Care Teams include your primary Cardiologist (physician) and Advanced Practice Providers (APPs -  Physician Assistants and Nurse Practitioners) who all work together to provide you with the care you need, when you need it.  Your next appointment:   2 month(s)   The format for your next appointment:   In Person  Provider:   Rollene Rotunda, MD  Other Instructions REFERRED TO ELECTROPHYSIOLOGY CLINIC FOR PACEMAKER

## 2019-10-01 ENCOUNTER — Ambulatory Visit: Payer: Medicare Other

## 2019-10-02 ENCOUNTER — Telehealth: Payer: Self-pay | Admitting: *Deleted

## 2019-10-02 NOTE — Telephone Encounter (Signed)
Call placed to the patient to discuss his referral to Dr. Royann Shivers. He has a pacemaker with Biotronik. He feels like his last download may have been in the Fall of 2020. He has been asked to make sure to bring his pacemaker card with him to his appointment with Dr. Royann Shivers. Records have been requested from his former provider.   He has been advised that we will check online and see when the last download took place but we may need him to try and do another one. We will call back after the download has been checked and move up his appointment.

## 2019-10-02 NOTE — Telephone Encounter (Signed)
Email sent to [facility], [employer], to assist with transferring patient in Home Monitoring

## 2019-10-02 NOTE — Telephone Encounter (Signed)
Call returned to the patient. His appointment has been moved up to 2/22 with Dr. Royann Shivers. He has been advised that he will be set up with our device clinic as well.   If he as any further questions, he will call the office.

## 2019-10-02 NOTE — Telephone Encounter (Signed)
Biotroniks download daily, as long as transmitter is set up. Just need to assign to our device clinic so we can review.

## 2019-10-06 ENCOUNTER — Other Ambulatory Visit: Payer: Self-pay

## 2019-10-06 ENCOUNTER — Encounter: Payer: Self-pay | Admitting: Internal Medicine

## 2019-10-06 ENCOUNTER — Ambulatory Visit: Payer: Medicare Other | Admitting: Internal Medicine

## 2019-10-06 VITALS — BP 122/64 | HR 73 | Ht 72.0 in | Wt 186.2 lb

## 2019-10-06 DIAGNOSIS — Z950 Presence of cardiac pacemaker: Secondary | ICD-10-CM | POA: Diagnosis not present

## 2019-10-06 DIAGNOSIS — I4891 Unspecified atrial fibrillation: Secondary | ICD-10-CM

## 2019-10-06 MED ORDER — PROPAFENONE HCL 225 MG PO TABS: 225.0000 mg | ORAL_TABLET | Freq: Two times a day (BID) | ORAL | 3 refills | Status: DC

## 2019-10-06 NOTE — Patient Instructions (Addendum)
Medication Instructions:  Your physician has recommended you make the following change in your medication:   Stop your Flecainide  Start Rythmol 225mg  by mouth twice daily.  Labwork: None ordered.  Testing/Procedures: None ordered.  Follow-Up: Your physician wants you to follow-up in: November 09, 2019 at 4pmweeks with Dr November 11, 2019 will receive a reminder letter in the mail two months in advance. If you don't receive a letter, please call our office to schedule the follow-up appointment.  Remote monitoring is used to monitor your Pacemaker of ICD from home. This monitoring reduces the number of office visits required to check your device to one time per year. It allows Antonio Curtis to keep an eye on the functioning of your device to ensure it is working properly.   Any Other Special Instructions Will Be Listed Below (If Applicable).  If you need a refill on your cardiac medications before your next appointment, please call your pharmacy.

## 2019-10-06 NOTE — Progress Notes (Signed)
ELECTROPHYSIOLOGY CONSULT NOTE  Patient ID: Antonio Curtis, MRN: [id number], DOB/AGE: 1941/04/09 79 y.o. Admit date: (Not on file) Date of Consult: 10/06/2019  Primary Physician: Marden Noble, MD Primary Cardiologist: [facility]     Antonio Curtis is a 79 y.o. male who is being seen today for the evaluation of pacer and afib at the request of Dr [facility].    HPI Antonio Curtis is a 79 y.o. male, father of Dr. Kourosh Jablonsky recently moved from [place], seen to establish pacemaker follow-up.  Biotronik implanted 12/18 for sinus pauses causing syncope in the setting of atrial fib for which she has been treated with flecainide and apixaban.  No significant bleeding issues.  Atrial fibrillation has been variably problematic.  Most currently he is on 100 mg twice daily having had a dose increase from 50 because of worsening symptoms.  He has had more symptoms of atrial fibrillation over the last couple of weeks.  Palpitations fatigue weakness and lightheadedness and some shortness of breath.  No clear triggers.   Thromboembolic risk factors ( age  -2) for a CHADSVASc Score of 2   Past Medical History:  Diagnosis Date  . A-fib ([facility])   . Hypothyroid   . 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 Sick sinus syndrome [facility])       Surgical History:  Past Surgical History:  Procedure Laterality Date  . ACHILLES TENDON REPAIR    . APPENDECTOMY    . COLON SURGERY     Hemicolectomy:  Polyp.    . TONSILLECTOMY       Home Meds: Current Meds  Medication Sig  . apixaban (ELIQUIS) 5 MG TABS tablet Take 5 mg by mouth 2 (two) times daily.  . Cholecalciferol (VITAMIN D3) 50 MCG (2000 UT) TABS Take by mouth.  . flecainide (TAMBOCOR) 100 MG tablet Take 100 mg by mouth 2 (two) times daily.  Marland Kitchen levothyroxine (SYNTHROID) 50 MCG tablet Take 50 mcg by mouth daily before breakfast.  . metoprolol tartrate 37.5 MG TABS Take 37.5 mg by mouth 2 (two) times daily.    Allergies: No Known  Allergies  Social History   Socioeconomic History  . Marital status: Married    Spouse name: Not on file  . Number of children: Not on file  . Years of education: Not on file  . Highest education level: Not on file  Occupational History  . Not on file  Tobacco Use  . Smoking status: Never Smoker  . Smokeless tobacco: Never Used  Substance and Sexual Activity  . Alcohol use: Never  . Drug use: Not Currently  . Sexual activity: Not on file  Other Topics Concern  . Not on file  Social History Narrative   Five children.  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     Social Determinants of Health   Financial Resource Strain:   . Difficulty of Paying Living Expenses: Not on file  Food Insecurity:   . Worried About Programme researcher, broadcasting/film/video in the Last Year: Not on file  . Ran Out of Food in the Last Year: Not on file  Transportation Needs:   . Lack of Transportation (Medical): Not on file  . Lack of Transportation (Non-Medical): Not on file  Physical Activity:   . Days of Exercise per Week: Not on file  . Minutes of Exercise per Session: Not on file  Stress:   . Feeling of Stress : Not on file  Social Connections:   .  Frequency of Communication with Friends and Family: Not on file  . Frequency of Social Gatherings with Friends and Family: Not on file  . Attends Religious Services: Not on file  . Active Member of Clubs or Organizations: Not on file  . Attends Archivist Meetings: Not on file  . Marital Status: Not on file  Intimate Partner Violence:   . Fear of Current or Ex-Partner: Not on file  . Emotionally Abused: Not on file  . Physically Abused: Not on file  . Sexually Abused: Not on file     Family History  Problem Relation Age of Onset  . Heart disease Father 72  . Diabetes Father      ROS:  Please see the history of present illness.     All other systems reviewed and negative.    Physical Exam:  Blood pressure 122/64, pulse 73, height 6' (1.829 m), weight 186 lb 3.2 oz  (84.5 kg), SpO2 96 %. General: Well developed, well nourished male in no acute distress. Head: Normocephalic, atraumatic, sclera non-icteric, no xanthomas, nares are without discharge. EENT: normal  Lymph Nodes:  none Neck: Negative for carotid bruits. JVD not elevated. Back:without scoliosis kyphosis  Device pocket well healed; without hematoma or erythema.  There is no tethering  Lungs: Clear bilaterally to auscultation without wheezes, rales, or rhonchi. Breathing is unlabored. Heart: RRR with S1 S2. No  murmur . No rubs, or gallops appreciated. Abdomen: Soft, non-tender, non-distended with normoactive bowel sounds. No hepatomegaly. No rebound/guarding. No obvious abdominal masses. Msk:  Strength and tone appear normal for age. Extremities: No clubbing or cyanosis. No* edema.  Distal pedal pulses are 2+ and equal bilaterally. Skin: Warm and Dry Neuro: Alert and oriented X 3. CN III-XII intact Grossly normal sensory and motor function . Psych:  Responds to questions appropriately with a normal affect.      Labs: Cardiac Enzymes No results for input(s): CKTOTAL, CKMB, TROPONINI in the last 72 hours. CBC Lab Results  Component Value Date   WBC 6.3 08/18/2019   HGB 17.6 (H) 08/18/2019   HCT 52.6 (H) 08/18/2019   MCV 92.0 08/18/2019   PLT 127 (L) 08/18/2019   PROTIME: No results for input(s): LABPROT, INR in the last 72 hours. Chemistry No results for input(s): NA, K, CL, CO2, BUN, CREATININE, CALCIUM, PROT, BILITOT, ALKPHOS, ALT, AST, GLUCOSE in the last 168 hours.  Invalid input(s): LABALBU Lipids No results found for: CHOL, HDL, LDLCALC, TRIG BNP No results found for: PROBNP Thyroid Function Tests: No results for input(s): TSH, T4TOTAL, T3FREE, THYROIDAB in the last 72 hours.  Invalid input(s): FREET3 Miscellaneous No results found for: DDIMER  Radiology/Studies:  No results found.  EKG: Personally reviewed  From 09/17/19 Atrial pacing at 80 Intervals 33/11/41 with  a QTC of 47   Assessment and Plan:  Atrial fibrillation-paroxysmal  Syncope  Sinus node dysfunction  1 AV block-significant  Pacemaker Biotronik  The patient's device was interrogated.  The information was reviewed. No changes were made in the programming.       The patient has paroxysmal atrial fibrillation with increasing burden.  Flecainide is no longer effective.  We discussed drug treatment options including propafenone as an alternative Multaq and amiodarone as potential outpatient initiation regimes.  We will switch him to propafenone.  We have reviewed side effects.  We will use a short acting drug twice daily as opposed to every 8.  As noted by Dr. Lenore Cordia, his QTC is long to consider  dofetilide; moreover, in the context of Covid we are not admitting patients to hospital for drug initiation.  In the event that propafenone is unsuccessful, we had a lengthy discussion regarding further alternatives including drugs and/or ablation about which he has been modestly reluctant.  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  He paces about 10% of the time.  Class Ic antiarrhythmics will aggravate his conduction system disease.  Being able to stop them may result in improvement.  At this point his ventricular pacing percentage is not sufficiently problematic nor does it seem to be symptomatic.      Sherryl Manges

## 2019-10-08 LAB — CUP PACEART INCLINIC DEVICE CHECK
Brady Statistic RA Percent Paced: 55 %
Brady Statistic RV Percent Paced: 10 %
Date Time Interrogation Session: 20210202150700
Implantable Lead Implant Date: 20181211190000
Implantable Lead Implant Date: 20181211190000
Implantable Lead Location: 753859
Implantable Lead Location: 753860
Implantable Lead Model: 377171
Implantable Lead Model: 377171
Implantable Lead Serial Number: 80523187
Implantable Lead Serial Number: 80573594
Implantable Pulse Generator Implant Date: 20181211190000
Lead Channel Impedance Value: 565 Ohm
Lead Channel Impedance Value: 663 Ohm
Lead Channel Pacing Threshold Amplitude: 1 V
Lead Channel Pacing Threshold Amplitude: 1 V
Lead Channel Pacing Threshold Pulse Width: 0.4 ms
Lead Channel Pacing Threshold Pulse Width: 0.4 ms
Lead Channel Sensing Intrinsic Amplitude: 14.2 mV
Lead Channel Sensing Intrinsic Amplitude: 9.1 mV
Lead Channel Setting Pacing Amplitude: 2 V
Lead Channel Setting Pacing Amplitude: 2.4 V
Lead Channel Setting Pacing Pulse Width: 0.4 ms
Pulse Gen Model: 407145
Pulse Gen Serial Number: 69192108

## 2019-10-10 ENCOUNTER — Ambulatory Visit: Payer: Medicare Other | Attending: Internal Medicine

## 2019-10-10 DIAGNOSIS — Z23 Encounter for immunization: Secondary | ICD-10-CM | POA: Insufficient documentation

## 2019-10-10 NOTE — Progress Notes (Signed)
   Covid-19 Vaccination Clinic  Name:  Antonio Curtis    MRN: [id number] DOB: 07/30/1941  10/10/2019  [name] was observed post Covid-19 immunization for 15 minutes without incidence. He was provided with Vaccine Information Sheet and instruction to access the V-Safe system.   [name] was instructed to call 911 with any severe reactions post vaccine: Marland Kitchen Difficulty breathing  . Swelling of your face and throat  . A fast heartbeat  . A bad rash all over your body  . Dizziness and weakness    Immunizations Administered    Name Date Dose VIS Date Route   Pfizer COVID-19 Vaccine 10/10/2019 11:18 AM 0.3 mL 08/14/2019 Intramuscular   Manufacturer: [employer], [employer]   Lot: [id number]   NDC: [address]-0

## 2019-10-12 NOTE — Telephone Encounter (Signed)
Home Monitoring successfully transferred in.

## 2019-10-26 ENCOUNTER — Telehealth: Payer: Self-pay | Admitting: Emergency Medicine

## 2019-10-26 ENCOUNTER — Encounter: Payer: Medicare Other | Admitting: Cardiovascular Disease

## 2019-10-26 NOTE — Telephone Encounter (Signed)
LMOM to call device clinic and # provided. AT/AF burden increased to 61%. Calling to assess if symptomatic.

## 2019-10-26 NOTE — Telephone Encounter (Signed)
Patient reports he is asymptomatic at this time. He reports that he is aware of the times when he is in AF. Has felt the AF episodes in the past week. Patient is taking Rhythmol 225 mg BID and has follow up with Dr Graciela Husbands on 11/09/19.

## 2019-11-03 NOTE — Telephone Encounter (Signed)
Will see him next week if he remains asymptomatic

## 2019-11-04 ENCOUNTER — Ambulatory Visit: Payer: Medicare Other | Attending: Internal Medicine

## 2019-11-04 DIAGNOSIS — Z23 Encounter for immunization: Secondary | ICD-10-CM | POA: Insufficient documentation

## 2019-11-04 NOTE — Progress Notes (Signed)
   Covid-19 Vaccination Clinic  Name:  Kace Hartje    MRN: [id number] DOB: 12-18-1940  11/04/2019  Mr. Menning was observed post Covid-19 immunization for 15 minutes without incident. He was provided with Vaccine Information Sheet and instruction to access the V-Safe system.   Mr. Carne was instructed to call 911 with any severe reactions post vaccine: Marland Kitchen Difficulty breathing  . Swelling of face and throat  . A fast heartbeat  . A bad rash all over body  . Dizziness and weakness   Immunizations Administered    Name Date Dose VIS Date Route   Pfizer COVID-19 Vaccine 11/04/2019  8:37 AM 0.3 mL 08/14/2019 Intramuscular   Manufacturer: [employer], [employer]   Lot: [id number]   NDC: [address]-3

## 2019-11-08 DIAGNOSIS — I4430 Unspecified atrioventricular block: Secondary | ICD-10-CM | POA: Insufficient documentation

## 2019-11-08 DIAGNOSIS — R55 Syncope and collapse: Secondary | ICD-10-CM | POA: Insufficient documentation

## 2019-11-08 DIAGNOSIS — I495 Sick sinus syndrome: Secondary | ICD-10-CM | POA: Insufficient documentation

## 2019-11-08 DIAGNOSIS — Z950 Presence of cardiac pacemaker: Secondary | ICD-10-CM | POA: Insufficient documentation

## 2019-11-09 ENCOUNTER — Other Ambulatory Visit: Payer: Self-pay

## 2019-11-09 ENCOUNTER — Ambulatory Visit: Payer: Medicare Other | Admitting: Internal Medicine

## 2019-11-09 ENCOUNTER — Encounter: Payer: Self-pay | Admitting: Internal Medicine

## 2019-11-09 VITALS — BP 104/64 | HR 85 | Ht 72.0 in | Wt 183.0 lb

## 2019-11-09 DIAGNOSIS — I495 Sick sinus syndrome: Secondary | ICD-10-CM

## 2019-11-09 DIAGNOSIS — I4430 Unspecified atrioventricular block: Secondary | ICD-10-CM | POA: Diagnosis not present

## 2019-11-09 DIAGNOSIS — I480 Paroxysmal atrial fibrillation: Secondary | ICD-10-CM | POA: Diagnosis not present

## 2019-11-09 DIAGNOSIS — Z950 Presence of cardiac pacemaker: Secondary | ICD-10-CM | POA: Diagnosis not present

## 2019-11-09 MED ORDER — PROPAFENONE HCL 225 MG PO TABS: 225.0000 mg | ORAL_TABLET | Freq: Three times a day (TID) | ORAL | 3 refills | Status: DC

## 2019-11-09 NOTE — Patient Instructions (Signed)
Medication Instructions:  Your physician has recommended you make the following change in your medication:   **Increase your Propafenone to 225mg  by mouth three times daily.   Labwork: None ordered.  Testing/Procedures: None ordered.  Follow-Up: Dr scheduler will call you to schedule appointment to discuss possible ablation. Your physician wants you to follow-up in: 6 months with Dr Jenel Lucks. You will receive a reminder letter in the mail two months in advance. If you don't receive a letter, please call our office to schedule the follow-up appointment.  Remote monitoring is used to monitor your Pacemaker of ICD from home. This monitoring reduces the number of office visits required to check your device to one time per year. It allows Graciela Husbands to keep an eye on the functioning of your device to ensure it is working properly.   Any Other Special Instructions Will Be Listed Below (If Applicable).  If you need a refill on your cardiac medications before your next appointment, please call your pharmacy.

## 2019-11-09 NOTE — Progress Notes (Signed)
Patient Care Team: Marden Noble, MD as PCP - General (Internal Medicine)   HPI  Antonio Curtis is a 79 y.o. male  father of Dr. Jetty Duhamel recently moved from [place], seen to establish pacemaker follow-up.  Biotronik implanted 12/18 for sinus pauses causing syncope in the setting of atrial fib for which she has been treated with flecainide and apixaban.  No significant bleeding .  Atrial fibrillation has been variably problematic.  not much better on propafenone   The patient denies chest pain, shortness of breath, nocturnal dyspnea, orthopnea or peripheral edema.  There have been no palpitations, lightheadedness or syncope.      Thromboembolic risk factors ( age  -2) for a CHADSVASc Score of 2  Records and Results Reviewed*   Past Medical History:  Diagnosis Date  . A-fib (HCC)   . Hypothyroid   . 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 Sick sinus syndrome [facility])     Past Surgical History:  Procedure Laterality Date  . ACHILLES TENDON REPAIR    . APPENDECTOMY    . COLON SURGERY     Hemicolectomy:  Polyp.    . TONSILLECTOMY      Current Meds  Medication Sig  . apixaban (ELIQUIS) 5 MG TABS tablet Take 5 mg by mouth 2 (two) times daily.  . Cholecalciferol (VITAMIN D3) 50 MCG (2000 UT) TABS Take by mouth.  . levothyroxine (SYNTHROID) 50 MCG tablet Take 50 mcg by mouth daily before breakfast.  . metoprolol tartrate 37.5 MG TABS Take 37.5 mg by mouth 2 (two) times daily.  . propafenone (RYTHMOL) 225 MG tablet Take 1 tablet (225 mg total) by mouth 2 (two) times daily.    No Known Allergies    Review of Systems negative except from HPI and PMH  Physical Exam BP 104/64   Pulse 85   Ht 6' (1.829 m)   Wt 183 lb (83 kg)   SpO2 98%   BMI 24.82 kg/m   Well developed and nourished in no acute distress HENT normal Neck supple with JVP-flat Carotids brisk and full without bruits Clear Irregularly irregular rate and rhythm with controlled   ventricular response, no murmurs or gallops Abd-soft with active BS without hepatomegaly No Clubbing cyanosis edema Skin-warm and dry A & Oriented  Grossly normal sensory and motor function     CrCl cannot be calculated (Patient's most recent lab result is older than the maximum 21 days allowed.).   Assessment and  Plan  Atrial fibrillation-paroxysmal  Syncope  Sinus node dysfunction  1 AV block-significant  Pacemaker Biotronik   Atrial fibrillation burden is somewhat increased on the propafenone to 25 twice daily.  We will increase it to every 8.  Not sanguine about its long-term benefits.  Reviewed risks and benefits of amiodarone.  He is not very excited.  We discussed again the option of catheter ablation.  He would like to consider this in greater depth.  We will arrange consultation with Dr. Fawn Kirk.  Based on QT prolongation would not be a candidate for class III's  Antegrade conduction may become a problem with ongoing antiarrhythmic therapy.  At this point ventricular pacing is only 6%.  This is diminished from flecainide.  We will see how he does on the higher dose of propafenone.  Pacemaker was not reprogrammed.  No interval syncope   Current medicines are reviewed at length with the patient today .  The patient does not  have  concerns regarding medicines.

## 2019-11-19 DIAGNOSIS — Z72820 Sleep deprivation: Secondary | ICD-10-CM | POA: Insufficient documentation

## 2019-11-19 NOTE — Progress Notes (Signed)
Cardiology Office Note   Date:  11/20/2019   ID:  Antonio Curtis, DOB 1941-05-15, MRN [id number]  PCP:  Antonio Huddle, MD Cardiologist:   Antonio Breeding, MD Referring:  Antonio Huddle, MD  Chief Complaint  Patient presents with  . Atrial Fibrillation      History of Present Illness: Antonio Curtis is a 79 y.o. male who is referred by Antonio Curtis, MDfor  evaluation of atrial fib.  He has moved here from [place].  He has had atrial fib for 5 - 6 years.  He has been treated with anticoagulation and has had PAF.  He has more recently been treated with flecainide.  However, he is not sure that this has helped.  He has paroxysms  He did have syncope at one point and was found to have sinus pauses and had a pacemaker placed in Dec 2018.   Of note he had a negative stress test per his report a year ago and an echo years ago.   He had an appt with Dr. Caryl Curtis and was switched to propafenone with the frequency increased at the last visit.   He is considering atrial fib ablation.   He says that he is feeling better.  He is having fewer palpitations.  He is sleeping a little bit better.  He has been taking melatonin. The patient denies any new symptoms such as chest discomfort, neck or arm discomfort. There has been no new shortness of breath, PND or orthopnea. There have been no reported palpitations, presyncope or syncope.  He says his fibrillation he is not taking any sustained episodes though I looked at his device and he says about 20% of the time.  This is less than previous.   Past Medical History:  Diagnosis Date  . A-fib ([place])   . Hypothyroid   . 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 Sick sinus syndrome [facility])     Past Surgical History:  Procedure Laterality Date  . ACHILLES TENDON REPAIR    . APPENDECTOMY    . COLON SURGERY     Hemicolectomy:  Polyp.    . TONSILLECTOMY       Current Outpatient Medications  Medication Sig Dispense Refill  . apixaban (ELIQUIS) 5 MG TABS tablet  Take 5 mg by mouth 2 (two) times daily.    . Cholecalciferol (VITAMIN D3) 50 MCG (2000 UT) TABS Take by mouth.    . levothyroxine (SYNTHROID) 50 MCG tablet Take 50 mcg by mouth daily before breakfast.    . metoprolol tartrate 37.5 MG TABS Take 37.5 mg by mouth 2 (two) times daily. 180 tablet 3  . propafenone (RYTHMOL) 225 MG tablet Take 1 tablet (225 mg total) by mouth every 8 (eight) hours. 180 tablet 3   No current facility-administered medications for this visit.    Allergies:   Patient has no known allergies.    ROS:  Please see the history of present illness.   Otherwise, review of systems are positive for none.   All other systems are reviewed and negative.    PHYSICAL EXAM: VS:  BP 118/76   Pulse 81   Temp 97.7 F (36.5 C) (Temporal)   Resp 15   Ht 6' (1.829 m)   Wt 186 lb (84.4 kg)   SpO2 99%   BMI 25.23 kg/m  , BMI Body mass index is 25.23 kg/m. GENERAL:  Well appearing NECK:  No jugular venous distention, waveform within normal limits, carotid upstroke brisk  and symmetric, no bruits, no thyromegaly LUNGS:  Clear to auscultation bilaterally CHEST:  Well healed pacemaker pocket.  HEART:  PMI not displaced or sustained,S1 and S2 within normal limits, no S3, no S4, no clicks, no rubs, no murmurs ABD:  Flat, positive bowel sounds normal in frequency in pitch, no bruits, no rebound, no guarding, positive midline pulsatile mass, no hepatomegaly, no splenomegaly EXT:  2 plus pulses throughout, no edema, no cyanosis no clubbing   EKG:  EKG is  ordered today. The ekg ordered today demonstrates Paced atrial rhythm with first degree AV block, rate and ventricular pacing.  No acute ST T wave changes.    Recent Labs: 08/18/2019: ALT 23; B Natriuretic Peptide 378.0; BUN 24; Creatinine, Ser 1.54; Hemoglobin 17.6; Platelets 127; Potassium 4.5; Sodium 132    Lipid Panel No results found for: CHOL, TRIG, HDL, CHOLHDL, VLDL, LDLCALC, LDLDIRECT    Wt Readings from Last 3  Encounters:  11/20/19 186 lb (84.4 kg)  11/09/19 183 lb (83 kg)  10/06/19 186 lb 3.2 oz (84.5 kg)      Other studies Reviewed: Additional studies/ records that were reviewed today include: None Review of the above records demonstrates:  Please see elsewhere in the note.     ASSESSMENT AND PLAN:  ATRIAL FIB:  He tolerates anticoagulation.  Mr. Antonio Curtis has a CHA2DS2 - VASc score of 2.  However, he seems to be doing better on the current regimen.  He would consider ablation if he gets worse again but wants to defer this for now.   SICK SINUS SYNDROME/PACEMAKER POCKET:   He has been followed in the EP clinic.  He has normal device function.  I looked at the last interrogation.  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  However, he feels like he has been sleeping better and would defer any thought a sleep study.   AAA: He has a pulsatile abdominal aorta and I will screen him with an ultrasound.  COVID EDUCATION:    He had Covid 19.  He has had his vaccine.    Current medicines are reviewed at length with the patient today.  The patient does not have concerns regarding medicines.  The following changes have been made:  None  Labs/ tests ordered today include:   Orders Placed This Encounter  Procedures  . EKG 12-Lead  . VAS [place] AAA DUPLEX     Disposition:   FU with me in 12 months.      Signed, Rollene Rotunda, MD  11/20/2019 8:52 AM    [facility]

## 2019-11-20 ENCOUNTER — Encounter (INDEPENDENT_AMBULATORY_CARE_PROVIDER_SITE_OTHER): Payer: Self-pay

## 2019-11-20 ENCOUNTER — Encounter: Payer: Self-pay | Admitting: Cardiology

## 2019-11-20 ENCOUNTER — Other Ambulatory Visit: Payer: Self-pay

## 2019-11-20 ENCOUNTER — Ambulatory Visit (INDEPENDENT_AMBULATORY_CARE_PROVIDER_SITE_OTHER): Payer: Medicare Other | Admitting: Cardiology

## 2019-11-20 ENCOUNTER — Ambulatory Visit (HOSPITAL_COMMUNITY)
Admission: RE | Admit: 2019-11-20 | Discharge: 2019-11-20 | Disposition: A | Discharge status: Home / Self Care | Payer: Medicare Other | Source: Ambulatory Visit | Attending: Cardiology | Admitting: Cardiology

## 2019-11-20 VITALS — BP 118/76 | HR 81 | Temp 97.7°F | Resp 15 | Ht 72.0 in | Wt 186.0 lb

## 2019-11-20 DIAGNOSIS — I480 Paroxysmal atrial fibrillation: Secondary | ICD-10-CM

## 2019-11-20 DIAGNOSIS — Z7189 Other specified counseling: Secondary | ICD-10-CM

## 2019-11-20 DIAGNOSIS — R0989 Other specified symptoms and signs involving the circulatory and respiratory systems: Secondary | ICD-10-CM | POA: Diagnosis not present

## 2019-11-20 DIAGNOSIS — Z136 Encounter for screening for cardiovascular disorders: Secondary | ICD-10-CM

## 2019-11-20 DIAGNOSIS — I495 Sick sinus syndrome: Secondary | ICD-10-CM | POA: Diagnosis not present

## 2019-11-20 DIAGNOSIS — Z72820 Sleep deprivation: Secondary | ICD-10-CM

## 2019-11-20 NOTE — Patient Instructions (Addendum)
Medication Instructions:  No changes *If you need a refill on your cardiac medications before your next appointment, please call your pharmacy*  Lab Work: None needed this visit  Testing/Procedures: Your physician has requested that you have an abdominal aorta duplex at 11am. During this test, an ultrasound is used to evaluate the aorta. Allow 30 minutes for this exam. Do not eat after midnight the day before and avoid carbonated beverages  Follow-Up: At [facility], you and your health needs are our priority.  As part of our continuing mission to provide you with exceptional heart care, we have created designated Provider Care Teams.  These Care Teams include your primary Cardiologist (physician) and Advanced Practice Providers (APPs -  Physician Assistants and Nurse Practitioners) who all work together to provide you with the care you need, when you need it.  We recommend signing up for the patient portal called "MyChart".  Sign up information is provided on this After Visit Summary.  MyChart is used to connect with patients for Virtual Visits (Telemedicine).  Patients are able to view lab/test results, encounter notes, upcoming appointments, etc.  Non-urgent messages can be sent to your provider as well.   To learn more about what you can do with MyChart, go to [email or web address].    Activation Code [id number]  Your next appointment:   12 month(s)  The format for your next appointment:   In Person  Provider:   Rollene Rotunda, MD

## 2019-11-25 ENCOUNTER — Telehealth: Payer: Self-pay | Admitting: Cardiology

## 2019-11-25 ENCOUNTER — Ambulatory Visit: Payer: Medicare Other | Admitting: Cardiovascular Disease

## 2019-11-25 NOTE — Telephone Encounter (Signed)
Patient calling for his aorta results.

## 2019-11-25 NOTE — Telephone Encounter (Signed)
Spoke with patient, results reviewed.

## 2019-12-07 ENCOUNTER — Other Ambulatory Visit: Payer: Self-pay

## 2019-12-07 ENCOUNTER — Encounter: Payer: Self-pay | Admitting: Internal Medicine

## 2019-12-07 ENCOUNTER — Telehealth (INDEPENDENT_AMBULATORY_CARE_PROVIDER_SITE_OTHER): Payer: Medicare Other | Admitting: Internal Medicine

## 2019-12-07 VITALS — Ht 72.0 in | Wt 188.0 lb

## 2019-12-07 DIAGNOSIS — I480 Paroxysmal atrial fibrillation: Secondary | ICD-10-CM

## 2019-12-07 DIAGNOSIS — R0683 Snoring: Secondary | ICD-10-CM

## 2019-12-07 NOTE — Progress Notes (Signed)
Electrophysiology TeleHealth Note  Due to national recommendations of social distancing due to F[place] 19, an audio telehealth visit is felt to be most appropriate for this patient at this time.  Verbal consent was obtained by me for the telehealth visit today.  The patient does not have capability for a virtual visit.  A phone visit is therefore required today.   Date:  12/07/2019   ID:  Antonio Curtis, DOB Jul 12, 1941, MRN [id number]  Location: patient's home  Provider location:  [place]  Evaluation Performed: Follow-up visit  PCP:  Josetta Huddle, MD   Electrophysiologist:  Dr Rayann Heman  Chief Complaint:  palpitations  History of Present Illness:    Antonio Curtis is a 79 y.o. male who presents via telehealth conferencing today.  I am seeing him for Dr Caryl Comes for consideration of ablation for his afib.  He reports initially being diagnosed with atrial fibrillation about 5 years ago after presenting to the ER with numbness down his L side (face, arm and leg) numbness.  He was noted to have afib at the time.  He was also found to be dehydrated at that time. He was placed on flecainide and eliquis.  He did well initially.  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  He had Biotronik PPM implanted by Dr [facility] in [place].  He moved to [facility] 07/2019 and has been followed by Dr Caryl Comes since that time.  Unfortunately, he began having increasing frequency and duration of atrial fibrillation.  Flecainide was switched to rhythmol, with some improvement in his afib. He still has afib every several weeks, lasting up to 3 days.  During afib, he reports symptoms of fatigue and SOB.  He has to sit in the recliner all day when in afib.  He also has postural dizziness during afib. Today, he denies symptoms of palpitations, chest pain, shortness of breath,  lower extremity edema, dizziness, presyncope, or syncope.  The patient is  otherwise without complaint today.    Past Medical History:  Diagnosis Date  . H/O partial resection of colon   . Hypothyroid   . 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 Paroxysmal atrial fibrillation (HCC)   . Sick sinus syndrome [facility])     Past Surgical History:  Procedure Laterality Date  . ACHILLES TENDON REPAIR    . APPENDECTOMY    . COLON SURGERY     Hemicolectomy:  Polyp.    Marland Kitchen PACEMAKER IMPLANT     Biotronik PPM implanted in [place] by Dr Joelene Millin.  . TONSILLECTOMY      Current Outpatient Medications  Medication Sig Dispense Refill  . apixaban (ELIQUIS) 5 MG TABS tablet Take 5 mg by mouth 2 (two) times daily.    . Cholecalciferol (VITAMIN D3) 50 MCG (2000 UT) TABS Take by mouth.    . levothyroxine (SYNTHROID) 50 MCG tablet Take 50 mcg by mouth daily before breakfast.    . metoprolol tartrate 37.5 MG TABS Take 37.5 mg by mouth 2 (two) times daily. 180 tablet 3  . propafenone (RYTHMOL) 225 MG tablet Take 1 tablet (225 mg total) by mouth every 8 (eight) hours. 180 tablet 3   No current facility-administered medications for this visit.    Allergies:   Patient has no known allergies.   Social History:  The patient  reports that he has never smoked. He has never used smokeless tobacco. He reports previous drug use.  He reports that he does not drink alcohol.   Family History:  The patient's family history includes Diabetes in his father; Heart disease (age of onset: 59) in his father.   ROS:  Please see the history of present illness.   All other systems are personally reviewed and negative.    Exam:    Vital Signs:  Ht 6' (1.829 m)   Wt 188 lb (85.3 kg)   BMI 25.50 kg/m   118/68  Well sounding, alert and conversant   Labs/Other Tests and Data Reviewed:    Recent Labs: 08/18/2019: ALT 23; B Natriuretic Peptide 378.0; BUN 24; Creatinine, Ser 1.54; Hemoglobin 17.6; Platelets 127; Potassium 4.5; Sodium 132   Wt Readings from Last 3 Encounters:  12/07/19 188 lb (85.3  kg)  11/20/19 186 lb (84.4 kg)  11/09/19 183 lb (83 kg)    ekg reveals AV paced rhythm  Last device interrogation is reviewed from PaceART PDF which reveals normal device function, AF burden is 20% by device interrogation 10/06/19  Echo 07/18/2017- EF normal,  Mild LA enlargement, no significant valvular disease   ASSESSMENT & PLAN:    1.  Paroxysmal atrial fibrillation The patient has symptomatic, recurrent paroxysmal atrial fibrillation. he has failed medical therapy with flecainide and rhythmol.  Dr Graciela Husbands does not feel that he would be a candidate for tikosyn/ sotalol due to qt prolongation Chads2vasc score is 2.  he is anticoagulated with eliquis . Therapeutic strategies for afib including medicine (amiodarone) and ablation were discussed in detail with the patient today. Risk, benefits, and alternatives to EP study and radiofrequency ablation for afib were also discussed in detail today. These risks include but are not limited to stroke, bleeding, vascular damage, tamponade, perforation, damage to the esophagus, lungs, and other structures, pulmonary vein stenosis, worsening renal function, and death. The patient understands these risk and wishes to continue "watchful waiting" at this time.  He thinks that propafenone has improved his symptoms and would like to give it a few months to decide.  He will contact my office if he decides to proceed with ablation.  2. Snoring Sleep study is advised   Patient Risk:  after full review of this patients clinical status, I feel that they are at moderate risk at this time.  Today, I have spent 15 minutes with the patient with telehealth technology discussing arrhythmia management .    Randolm Idol, MD  12/07/2019 4:14 PM     [facility] [address] [place] [address] [phone] (office) [phone] (fax)

## 2019-12-08 ENCOUNTER — Telehealth: Payer: Self-pay

## 2019-12-08 DIAGNOSIS — R0683 Snoring: Secondary | ICD-10-CM

## 2019-12-08 NOTE — Telephone Encounter (Signed)
-----   Message from [name], MD sent at 12/07/2019  4:09 PM EDT ----- Please order a sleep study to evaluate snoring.  Ok to use Dr Malachy Mood new home study.   Follow-up with me in 3 months

## 2019-12-09 NOTE — Telephone Encounter (Signed)
Completed work up for home sleep study and faxed to [employer].

## 2019-12-09 NOTE — Telephone Encounter (Signed)
Patient Name: Antonio Curtis        DOB: [birth year]      Height:  6'    Weight: 188 pounds  Office Name:  [facility]         Referring Provider:  Dr. [name]  Today's Date:  12/09/2019   STOP BANG RISK ASSESSMENT S (snore) Have you been told that you snore?     YES   T (tired) Are you often tired, fatigued, or sleepy during the day?   YES  O (obstruction) Do you stop breathing, choke, or gasp during sleep? NO   P (pressure) Do you have or are you being treated for high blood pressure? NO   B (BMI) Is your body index greater than 35 kg/m? no   A (age) Are you 79 years old or older? YES   N (neck) Do you have a neck circumference greater than 16 inches?   NO   G (gender) Are you a male? YES   TOTAL STOP/BANG "YES" ANSWERS                                                                        For Office Use Only              Procedure Order Form    YES to 3+ Stop Bang questions OR two clinical symptoms - patient qualifies for WatchPAT (CPT 95800)     Submit: This Form + Patient Face Sheet + Clinical Note via CloudPAT or Fax: [phone]         Clinical Notes: Will consult Sleep Specialist and refer for management of therapy due to patient increased risk of Sleep Apnea. Ordering a sleep study due to the following two clinical symptoms: Excessive daytime sleepiness G47.10 / Gastroesophageal reflux K21.9 / Nocturia R35.1 / Morning Headaches G44.221 / Difficulty concentrating R41.840 / Memory problems or poor judgment G31.84 / Personality changes or irritability R45.4 / Loud snoring R06.83 / Depression F32.9 / Unrefreshed by sleep G47.8 / Impotence N52.9 / History of high blood pressure R03.0 / Insomnia G47.00    I understand that I am proceeding with a home sleep apnea test as ordered by my treating physician. I understand that untreated sleep apnea is a serious cardiovascular risk factor and it is my responsibility to perform the test and seek management for sleep apnea.  I will be contacted with the results and be managed for sleep apnea by a local sleep physician. I will be receiving equipment and further instructions from [facility]. I shall promptly ship back the equipment via the included mailing label. I understand my insurance will be billed for the test and as the patient I am responsible for any insurance related out-of-pocket costs incurred. I have been provided with written instructions and can call for additional video or telephonic instruction, with 24-hour availability of qualified personnel to answer any questions: Patient Help Desk [phone].  Patient Signature ______________________________________________________   Date______________________ Patient Telemedicine Verbal Consent

## 2019-12-09 NOTE — Telephone Encounter (Signed)
Attempted outreach.  Call went to VM.  No room to leave a message.

## 2019-12-16 ENCOUNTER — Other Ambulatory Visit: Payer: Self-pay

## 2019-12-16 MED ORDER — PROPAFENONE HCL 225 MG PO TABS: 225.0000 mg | ORAL_TABLET | Freq: Three times a day (TID) | ORAL | 3 refills | Status: DC

## 2019-12-28 ENCOUNTER — Encounter (INDEPENDENT_AMBULATORY_CARE_PROVIDER_SITE_OTHER): Payer: Medicare Other | Admitting: Cardiology

## 2019-12-28 DIAGNOSIS — G4733 Obstructive sleep apnea (adult) (pediatric): Secondary | ICD-10-CM

## 2020-01-05 ENCOUNTER — Ambulatory Visit (INDEPENDENT_AMBULATORY_CARE_PROVIDER_SITE_OTHER): Payer: Medicare Other | Admitting: *Deleted

## 2020-01-05 DIAGNOSIS — I495 Sick sinus syndrome: Secondary | ICD-10-CM | POA: Diagnosis not present

## 2020-01-05 DIAGNOSIS — I4430 Unspecified atrioventricular block: Secondary | ICD-10-CM

## 2020-01-05 LAB — CUP PACEART REMOTE DEVICE CHECK
Date Time Interrogation Session: 20210504102232
Implantable Lead Implant Date: 20181212
Implantable Lead Implant Date: 20181212
Implantable Lead Location: 753859
Implantable Lead Location: 753860
Implantable Lead Model: 377
Implantable Lead Model: 377
Implantable Lead Serial Number: 80523187
Implantable Lead Serial Number: 80573594
Implantable Pulse Generator Implant Date: 20181212
Pulse Gen Model: 407145
Pulse Gen Serial Number: 69192108

## 2020-01-06 NOTE — Progress Notes (Signed)
Remote pacemaker transmission.   

## 2020-01-07 ENCOUNTER — Other Ambulatory Visit: Payer: Self-pay

## 2020-01-07 ENCOUNTER — Ambulatory Visit: Payer: Medicare Other

## 2020-01-07 NOTE — Procedures (Signed)
   Sleep Study Report  Patient Information Name: Antonio Curtis  ID: [id number] Birth Date: 1941-01-25  Age: 79  Gender:Male BMI: 25.4 (W=187 lb, H=6' 0'') Study Date: 12/27/3029 Referring Physician:  Sherryl Manges, MD  TEST DESCRIPTION: Home sleep apnea testing was completed using the WatchPat, a Type 1 device, utilizing peripheral arterial tonometry (PAT), chest movement, actigraphy, pulse oximetry, pulse rate, body position and snore. AHI was calculated with apnea and hypopnea using valid sleep time as the denominator. RDI includes apneas, hypopneas, and RERAs. The data acquired and the scoring of sleep and all associated events were performed in accordance with the recommended standards and specifications as outlined in the AASM Manual for the Scoring of Sleep and Associated Events 2.2.0 (2015).  FINDINGS: 1. Moderate Obstructive Sleep Apnea with AHI 17.5/hr.  2. Mild Central Sleep Apnea with pAHIc 7.9/hr. 3. Oxygen desaturations as low as 86%. 4. Mild snoring was present. O2 sats were < 88% for 0 min. 5. Total sleep time was 6 hrs and 35 min. 6. 14.0% of total sleep time was spent in REM sleep.  7. Normal sleep onset latency at 19 min.  8. Short REM sleep onset latency at 44 min.  9. Total awakenings were 5.   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 Clinical correlation of these findings is necessary. The decision to treat obstructive sleep apnea (OSA) is usually based on the presence of apnea symptoms or the presence of associated medical conditions such as Hypertension, Congestive Heart Failure, Atrial Fibrillation or Obesity. The most common symptoms of OSA are snoring, gasping for breath while sleeping, daytime sleepiness and fatigue.   2. Initiating apnea therapy is recommended given the presence of symptoms and/or associated conditions.   Recommend proceeding with one of the following:  a. Auto-CPAP therapy with a pressure range of 5-20cm  H2O.   b. An oral appliance (OA) that can be obtained from certain dentists with expertise in sleep medicine. These are  primarily of use in non-obese patients with mild and moderate disease.   c. An ENT consultation which may be useful to look for specific causes of obstruction and possible treatment Options.   d. If patient is intolerant to PAP therapy, consider referral to ENT for evaluation for hypoglossal nerve stimulator.   3. Close follow-up is necessary to ensure success with CPAP or oral appliance therapy for maximum benefit .  4. A follow-up oximetry study on CPAP is recommended to assess the adequacy of therapy and determine the need for supplemental oxygen or the potential need for Bi-level therapy. An arterial blood gas to determine the adequacy of baseline ventilation and oxygenation should also be considered.  5. Healthy sleep recommendations include: adequate nightly sleep (normal 7-9 hrs/night), avoidance of caffeine after noon and alcohol near bedtime, and maintaining a sleep environment that is cool, dark and quiet.  6. Weight loss for overweight patients is recommended. Even modest amounts of weight loss can significantly improve the severity of sleep apnea.  7. Snoring recommendations include: weight loss where appropriate, side sleeping, and avoidance of alcohol before Bed.  8. Operation of motor vehicle or dangerous equipment must be avoided when feeling drowsy, excessively sleepy, or  mentally fatigued.  Report prepared by: Signature: Armanda Magic Electronically Signed: Jan 07, 2020

## 2020-01-14 ENCOUNTER — Telehealth: Payer: Self-pay | Admitting: *Deleted

## 2020-01-14 NOTE — Telephone Encounter (Signed)
-----   Message from Quintella Reichert, MD sent at 01/07/2020 10:51 AM EDT ----- Please let patient know that they have sleep apnea and recommend auto CPAP titration through Local DME Orders have been placed in Epic. Please set 8 week OV with me.

## 2020-01-14 NOTE — Telephone Encounter (Addendum)
Informed patient of sleep study results and patient understanding was verbalized. Patient understands his sleep study showed they have sleep apnea and recommend auto CPAP titration through local DME Orders have been placed in Epic. Please set 10 week OV with me.  Upon patient request DME selection is [facility].  Patient understands HE will be contacted by [facility] to set up HIS cpap. Patient understands to call if [facility] does not contact HIM with new setup in a timely manner. Patient understands they will be called once confirmation has been received from [facility] that they have received their new machine to schedule 10 week follow up appointment.  [facility] notified of new cpap order  Please add to airview Patient was grateful for the call and thanked me.   

## 2020-01-25 NOTE — Telephone Encounter (Signed)
[facility]

## 2020-03-04 ENCOUNTER — Other Ambulatory Visit: Payer: Self-pay | Admitting: Cardiology

## 2020-03-04 DIAGNOSIS — I480 Paroxysmal atrial fibrillation: Secondary | ICD-10-CM

## 2020-03-04 NOTE — Telephone Encounter (Signed)
Prescription refill request for Eliquis received. Indication: a fib Last office visit: 12/28/19 Scr: 1.54 Age: 79 Weight:  85 kg

## 2020-03-14 ENCOUNTER — Telehealth: Payer: Medicare Other | Admitting: Internal Medicine

## 2020-03-21 ENCOUNTER — Telehealth: Payer: Self-pay | Admitting: Emergency Medicine

## 2020-03-21 NOTE — Telephone Encounter (Signed)
The pt left a message returning [place] phone call.

## 2020-03-21 NOTE — Telephone Encounter (Signed)
Patient is aware that he is in AF. He feels tired and gets SOB with activity as he usually does when AF, no CP, no dizziness and no syncope. He is currently enroute to [place] for a vacation the next 5-7 days . The patient has his remote monitor with him for the trip. Patient has telemedice f/u with Dr Johney Frame  04/06/20. Patient has not missed any doses of his metoprolol, rhythmol or Eliquis. ED precautions given. Will forward to Dr Johney Frame for review.

## 2020-03-21 NOTE — Telephone Encounter (Signed)
LMOM to call DC , phone # and office hours provided. AT/AF burden 100 % at time of 03/21/20 alert. + Eliquis. Historically symptomaic with AF.

## 2020-03-27 NOTE — Telephone Encounter (Signed)
He has had increased afib for several months and a recent office visit with me.  We do not need to call him regarding afib episodes.

## 2020-04-05 ENCOUNTER — Ambulatory Visit (INDEPENDENT_AMBULATORY_CARE_PROVIDER_SITE_OTHER): Payer: Medicare Other | Admitting: *Deleted

## 2020-04-05 DIAGNOSIS — I4430 Unspecified atrioventricular block: Secondary | ICD-10-CM

## 2020-04-05 LAB — CUP PACEART REMOTE DEVICE CHECK
Date Time Interrogation Session: 20210803104710
Implantable Lead Implant Date: 20181212
Implantable Lead Implant Date: 20181212
Implantable Lead Location: 753859
Implantable Lead Location: 753860
Implantable Lead Model: 377
Implantable Lead Model: 377
Implantable Lead Serial Number: 80523187
Implantable Lead Serial Number: 80573594
Implantable Pulse Generator Implant Date: 20181212
Pulse Gen Model: 407145
Pulse Gen Serial Number: 69192108

## 2020-04-05 NOTE — Telephone Encounter (Addendum)
Reached out to patient to inform him he has a 10 week follow up appointment scheduled for 04/28/20. Patient understands she needs to keep this appointment for insurance compliance.  Left detailed message on voicemail with date and time of appt. and informed patient to call back to confirm or reschedule.

## 2020-04-06 ENCOUNTER — Other Ambulatory Visit: Payer: Self-pay

## 2020-04-06 ENCOUNTER — Encounter: Payer: Self-pay | Admitting: Internal Medicine

## 2020-04-06 ENCOUNTER — Telehealth (INDEPENDENT_AMBULATORY_CARE_PROVIDER_SITE_OTHER): Payer: Medicare Other | Admitting: Internal Medicine

## 2020-04-06 VITALS — BP 116/73 | HR 76 | Ht 72.0 in | Wt 185.0 lb

## 2020-04-06 DIAGNOSIS — D6869 Other thrombophilia: Secondary | ICD-10-CM

## 2020-04-06 DIAGNOSIS — I495 Sick sinus syndrome: Secondary | ICD-10-CM | POA: Diagnosis not present

## 2020-04-06 DIAGNOSIS — I4819 Other persistent atrial fibrillation: Secondary | ICD-10-CM

## 2020-04-06 DIAGNOSIS — G4733 Obstructive sleep apnea (adult) (pediatric): Secondary | ICD-10-CM

## 2020-04-06 NOTE — Progress Notes (Signed)
Electrophysiology TeleHealth Note  Due to national recommendations of social distancing due to COVID 19, an audio telehealth visit is felt to be most appropriate for this patient at this time.  Verbal consent was obtained by me for the telehealth visit today.  The patient does not have capability for a virtual visit.  A phone visit is therefore required today.   Date:  04/06/2020   ID:  Antonio Curtis, DOB 1941/01/28, MRN [id number]  Location: patient's home  Provider location:  [place]  Evaluation Performed: Follow-up visit  PCP:  Marden Noble, MD   Electrophysiologist:  Dr Johney Frame  Chief Complaint:  palpitations  History of Present Illness:    Antonio Curtis is a 79 y.o. male who presents via telehealth conferencing today.  Since last being seen in our clinic, the patient reports doing reasonably well.  His afib has progressed.  He thinks that he is in afib most of the time.  + SOB and dizziness.  + fatigue.  Today, he denies symptoms of palpitations, chest pain, lower extremity edema, presyncope, or syncope.  The patient is otherwise without complaint today.     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     PACEMAKER IMPLANT     Biotronik PPM implanted in [place] by Dr [name].   TONSILLECTOMY      Current Outpatient Medications  Medication Sig Dispense Refill   ELIQUIS 5 MG TABS tablet TAKE 1 TABLET BY MOUTH EACH MORNING AND EVENING AS DIRECTED (NEEDS TO SCHEDULE F/U APPT) 60 tablet 1   levothyroxine (SYNTHROID) 50 MCG tablet Take 50 mcg by mouth daily before breakfast.     metoprolol tartrate 37.5 MG TABS Take 37.5 mg by mouth 2 (two) times daily. 180  tablet 3   propafenone (RYTHMOL) 225 MG tablet Take 1 tablet (225 mg total) by mouth every 8 (eight) hours. 270 tablet 3   No current facility-administered medications for this visit.    Allergies:   Patient has no known allergies.   Social History:  The patient  reports that he has never smoked. He has never used smokeless tobacco. He reports previous drug use. He reports that he does not drink alcohol.   ROS:  Please see the history of present illness.   All other systems are personally reviewed and negative.    Exam:    Vital Signs:  BP 116/73    Pulse 76    Ht 6' (1.829 m)    Wt 185 lb (83.9 kg)    BMI 25.09 kg/m   Well sounding, alert and conversant   Labs/Other Tests and Data Reviewed:    Recent Labs: 08/18/2019: ALT 23; B Natriuretic Peptide 378.0; BUN 24; Creatinine, Ser 1.54; Hemoglobin 17.6; Platelets 127; Potassium 4.5; Sodium 132   Wt Readings from Last 3 Encounters:  04/06/20 185 lb (83.9 kg)  12/07/19 188 lb (85.3 kg)  11/20/19 186 lb (84.4 kg)     Last device remote is reviewed from [employer] PDF which reveals normal device function, afib burden is increased   ASSESSMENT & PLAN:    1.  Persistent atrial fibrillation The patient has symptomatic, recurrent persistent atrial fibrillation. he has failed medical therapy with flecainide and propanone. Chads2vasc score is 2.  he is anticoagulated with eliquis . Therapeutic strategies for afib including medicine and ablation were discussed in detail with the patient today. Risk, benefits, and alternatives to EP study and radiofrequency ablation for afib were also discussed in detail today. These risks include but are not limited to stroke, bleeding, vascular damage, tamponade, perforation, damage to the esophagus, lungs, and other structures, pulmonary vein stenosis, worsening renal function, and death. The patient understands these risk and wishes to proceed.  We will therefore proceed with catheter ablation at the next  available time.  Carto, ICE, anesthesia are requested for the procedure.  Will also obtain cardiac CT prior to the procedure to exclude LAA thrombus and further evaluate atrial anatomy.  We will also repeat echo prior to ablation.  2. OSA Recently diagnosed He is now using CPAP  3. Sick sinus syndrome Remotes are up to date Normal device function  Risks, benefits and potential toxicities for medications prescribed and/or refilled reviewed with patient today.    Patient Risk:  after full review of this patients clinical status, I feel that they are at moderate risk at this time.  Today, I have spent 15 minutes with the patient with telehealth technology discussing arrhythmia management .    SignedHillis Range, MD  04/06/2020 2:28 PM     [facility] [address] [place] [address] [phone] (office) [phone] (fax)

## 2020-04-07 ENCOUNTER — Telehealth: Payer: Self-pay | Admitting: *Deleted

## 2020-04-07 DIAGNOSIS — I4819 Other persistent atrial fibrillation: Secondary | ICD-10-CM

## 2020-04-07 DIAGNOSIS — I4891 Unspecified atrial fibrillation: Secondary | ICD-10-CM

## 2020-04-07 NOTE — Telephone Encounter (Signed)
Left massage for patient to call the office

## 2020-04-07 NOTE — Progress Notes (Signed)
Remote pacemaker transmission.   

## 2020-04-07 NOTE — Telephone Encounter (Signed)
Echo order placed  Working with scheduling

## 2020-04-07 NOTE — Telephone Encounter (Signed)
-----   Message from [name], MD sent at 04/06/2020  2:37 PM EDT ----- Echo prior to ablation  Afib ablation C/I/A  Cardiac CT

## 2020-04-07 NOTE — Telephone Encounter (Signed)
Called patient left message with Echo appt information  Will call back a confirm patient aware

## 2020-04-13 NOTE — Telephone Encounter (Signed)
Advised patient of appointment time for Echo and location. Patient verbalized understanding.

## 2020-04-14 ENCOUNTER — Encounter: Payer: Self-pay | Admitting: *Deleted

## 2020-04-14 MED ORDER — METOPROLOL TARTRATE 50 MG PO TABS
50.0000 mg | ORAL_TABLET | ORAL | 0 refills | Status: DC
Start: 2020-04-14 — End: 2020-06-17

## 2020-04-14 NOTE — Telephone Encounter (Signed)
Procedure scheduled  Workup completed 

## 2020-04-14 NOTE — Addendum Note (Signed)
Addended by: Sampson Goon on: 04/14/2020 03:48 PM   Modules accepted: Orders

## 2020-04-15 ENCOUNTER — Telehealth: Payer: Self-pay | Admitting: *Deleted

## 2020-04-15 NOTE — Telephone Encounter (Signed)
bipap titration sent to sleep pool. 

## 2020-04-15 NOTE — Telephone Encounter (Signed)
-----   Message from Quintella Reichert, MD sent at 04/15/2020  7:56 AM EDT ----- I have reviewed his download and his AHI is very high on CPAP. Please refer him to sleep lab for BiPAP titration  Traci ----- Message ----- From: Reesa Chew, CMA Sent: 04/14/2020   5:22 PM EDT To: Quintella Reichert, MD  Please advise ----- Message ----- From: [name], MD Sent: 04/06/2020   2:34 PM EDT To: Quintella Reichert, MD, Reesa Chew, CMA  Having trouble using CPAP due to high pressures which break the seal as it autotitrates to high pressures.  Looks like you have an appointment with him 8/26.

## 2020-04-18 ENCOUNTER — Telehealth: Payer: Self-pay | Admitting: *Deleted

## 2020-04-18 ENCOUNTER — Encounter: Payer: Self-pay | Admitting: *Deleted

## 2020-04-18 DIAGNOSIS — G4733 Obstructive sleep apnea (adult) (pediatric): Secondary | ICD-10-CM

## 2020-04-18 NOTE — Telephone Encounter (Signed)
-----   Message from Gaynelle Cage, CMA sent at 04/18/2020 10:22 AM EDT ----- Regarding: RE: precert Ok to schedule BIPAP titration. Per [facility] web portal no PA is required. ----- Message ----- From: Reesa Chew, CMA Sent: 04/15/2020   6:28 PM EDT To: [facility] Sleep Studies Subject: precert                                        BiPAP titration

## 2020-04-18 NOTE — Telephone Encounter (Signed)
Staff message sent to [name] ok to schedule BIPAP titration. Per [facility] does not require a PA. Decision [id number].

## 2020-04-19 NOTE — Telephone Encounter (Signed)
Patient wants to cancel his bipap titration on 9/19 because he has an appointment for an ablation on 9/17.

## 2020-04-19 NOTE — Telephone Encounter (Signed)
This encounter was created in error - please disregard.

## 2020-04-19 NOTE — Telephone Encounter (Signed)
-----   Message from Wanda M Waddell, CMA sent at 04/18/2020 10:22 AM EDT ----- Regarding: RE: precert Ok to schedule BIPAP titration. Per [employer] web portal no PA is required. ----- Message ----- From: Jamicah Anstead G, CMA Sent: 04/15/2020   6:28 PM EDT To: [facility] Div Sleep Studies Subject: precert                                        BiPAP titration    

## 2020-04-20 ENCOUNTER — Other Ambulatory Visit: Payer: Self-pay

## 2020-04-20 ENCOUNTER — Ambulatory Visit (HOSPITAL_COMMUNITY): Payer: Medicare Other | Attending: Cardiovascular Disease

## 2020-04-20 DIAGNOSIS — I4819 Other persistent atrial fibrillation: Secondary | ICD-10-CM | POA: Diagnosis not present

## 2020-04-20 LAB — ECHOCARDIOGRAM COMPLETE
Area-P 1/2: 4.24 cm2
S' Lateral: 2.3 cm

## 2020-04-27 ENCOUNTER — Other Ambulatory Visit: Payer: Self-pay | Admitting: Cardiology

## 2020-04-27 DIAGNOSIS — I480 Paroxysmal atrial fibrillation: Secondary | ICD-10-CM

## 2020-04-28 ENCOUNTER — Encounter: Payer: Self-pay | Admitting: Cardiology

## 2020-04-28 ENCOUNTER — Other Ambulatory Visit: Payer: Medicare Other | Admitting: *Deleted

## 2020-04-28 ENCOUNTER — Telehealth: Payer: Self-pay

## 2020-04-28 ENCOUNTER — Telehealth (INDEPENDENT_AMBULATORY_CARE_PROVIDER_SITE_OTHER): Payer: Medicare Other | Admitting: Cardiology

## 2020-04-28 ENCOUNTER — Other Ambulatory Visit: Payer: Self-pay

## 2020-04-28 VITALS — BP 114/82 | HR 79 | Ht 72.0 in | Wt 184.0 lb

## 2020-04-28 DIAGNOSIS — G4733 Obstructive sleep apnea (adult) (pediatric): Secondary | ICD-10-CM | POA: Diagnosis not present

## 2020-04-28 DIAGNOSIS — I4819 Other persistent atrial fibrillation: Secondary | ICD-10-CM

## 2020-04-28 DIAGNOSIS — I4891 Unspecified atrial fibrillation: Secondary | ICD-10-CM

## 2020-04-28 LAB — CBC WITH DIFFERENTIAL/PLATELET
Basophils Absolute: 0.1 10*3/uL (ref 0.0–0.2)
Basos: 1 %
EOS (ABSOLUTE): 0.2 10*3/uL (ref 0.0–0.4)
Eos: 2 %
Hematocrit: 46.7 % (ref 37.5–51.0)
Hemoglobin: 16 g/dL (ref 13.0–17.7)
Immature Grans (Abs): 0 10*3/uL (ref 0.0–0.1)
Immature Granulocytes: 0 %
Lymphocytes Absolute: 2.1 10*3/uL (ref 0.7–3.1)
Lymphs: 24 %
MCH: 31.1 pg (ref 26.6–33.0)
MCHC: 34.3 g/dL (ref 31.5–35.7)
MCV: 91 fL (ref 79–97)
Monocytes Absolute: 0.9 10*3/uL (ref 0.1–0.9)
Monocytes: 11 %
Neutrophils Absolute: 5.3 10*3/uL (ref 1.4–7.0)
Neutrophils: 62 %
Platelets: 175 10*3/uL (ref 150–450)
RBC: 5.14 x10E6/uL (ref 4.14–5.80)
RDW: 12.9 % (ref 11.6–15.4)
WBC: 8.6 10*3/uL (ref 3.4–10.8)

## 2020-04-28 LAB — BASIC METABOLIC PANEL
BUN/Creatinine Ratio: 10 (ref 10–24)
BUN: 14 mg/dL (ref 8–27)
CO2: 25 mmol/L (ref 20–29)
Calcium: 9.4 mg/dL (ref 8.6–10.2)
Chloride: 102 mmol/L (ref 96–106)
Creatinine, Ser: 1.4 mg/dL — ABNORMAL HIGH (ref 0.76–1.27)
GFR calc Af Amer: 56 mL/min/{1.73_m2} — ABNORMAL LOW (ref >59)
GFR calc non Af Amer: 48 mL/min/{1.73_m2} — ABNORMAL LOW (ref >59)
Glucose: 101 mg/dL — ABNORMAL HIGH (ref 65–99)
Potassium: 4.4 mmol/L (ref 3.5–5.2)
Sodium: 138 mmol/L (ref 134–144)

## 2020-04-28 NOTE — Progress Notes (Signed)
Virtual Visit via Telephone Note   This visit type was conducted due to national recommendations for restrictions regarding the COVID-19 Pandemic (e.g. social distancing) in an effort to limit this patient's exposure and mitigate transmission in our community.  Due to his co-morbid illnesses, this patient is at least at moderate risk for complications without adequate follow up.  This format is felt to be most appropriate for this patient at this time.  The patient did not have access to video technology/had technical difficulties with video requiring transitioning to audio format only (telephone).  All issues noted in this document were discussed and addressed.  No physical exam could be performed with this format.  Please refer to the patient's chart for his  consent to telehealth for [facility].  Evaluation Performed:  Follow-up visit  This visit type was conducted due to national recommendations for restrictions regarding the COVID-19 Pandemic (e.g. social distancing).  This format is felt to be most appropriate for this patient at this time.  All issues noted in this document were discussed and addressed.  No physical exam was performed (except for noted visual exam findings with Video Visits).  Please refer to the patient's chart (MyChart message for video visits and phone note for telephone visits) for the patient's consent to telehealth for [facility].  Date:  04/28/2020   ID:  Antonio Curtis, DOB 1941/08/11, MRN [id number]  Patient Location:  Home  Provider location:   [place]  PCP:  Marden Noble, MD  Cardiologist:  Rollene Rotunda, MD  Sleep Medicine:  Armanda Magic, MD Electrophysiologist:  Sherryl Manges, MD  Chief Complaint:  OSA  History of Present Illness:    Antonio Curtis is a 79 y.o. male who presents via audio/video conferencing for a telehealth visit today.    This is a 79yo male with a hx of SSS s/p PPM and PAF who was referred for sleep evaluation by Dr.  Graciela Husbands. He tells me that Dr. Graciela Husbands wanted the sleep study done due to PAF.  He tells me that he would wake up a lot during the night for no reason but denies any awakening gasping for breath or choking.  He says that his wife tells him that he snores when on his back.  He wakes up feeling tired during the day and will fall asleep reading or sitting down.     He underwent home sleep study showing moderate OSA with an AHI of 17.5/hr and O2 sats as low as 86% but did not reach criteria for nocturnal hypoxemia.  Mild snoring was noted. He slept for a total of 6 hours and 35 min with short REM sleep onset latency of 44 min and normal sleep latency onset.  He was started on auto CPAP and is now here for followup.    He tells me that he has good nights and bad nights on the PAP.  He has tried 3 different types of masks and is now using the full face mask but when the pressure ramps up to high it breaks the pressure seal and then has to turn the machine off and turn it back on.  He says that overall he is sleeping better with the device than without it. He has a lot of mouth dryness with the high pressure.  He does feel more rested in the am than before.   The patient does not have symptoms concerning for COVID-19 infection (fever, chills, cough, or new shortness of breath).   Prior CV  studies:   The following studies were reviewed today:  Home sleep study, PAP compliance download  Past Medical History:  Diagnosis Date  . H/O partial resection of colon   . Hypothyroid   . 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 Paroxysmal atrial fibrillation (HCC)   . Sick sinus syndrome [facility])    Past Surgical History:  Procedure Laterality Date  . ACHILLES TENDON REPAIR    . APPENDECTOMY    . COLON SURGERY     Hemicolectomy:  Polyp.    Marland Kitchen PACEMAKER IMPLANT     Biotronik PPM implanted in [place] by Dr [name].  . TONSILLECTOMY       Current Meds  Medication Sig  . Cholecalciferol (VITAMIN D-3) 125 MCG (5000  UT) TABS Take by mouth.  . ELIQUIS 5 MG TABS tablet TAKE 1 TABLET BY MOUTH EACH MORNING AND EVENING AS DIRECTED (NEEDS TO SCHEDULE F/U APPT)  . levothyroxine (SYNTHROID) 50 MCG tablet Take 70 mcg by mouth daily before breakfast.   . metoprolol tartrate (LOPRESSOR) 50 MG tablet Take 1 tablet (50 mg total) by mouth as directed.  . metoprolol tartrate 37.5 MG TABS Take 37.5 mg by mouth 2 (two) times daily.  . propafenone (RYTHMOL) 225 MG tablet Take 1 tablet (225 mg total) by mouth every 8 (eight) hours.     Allergies:   Patient has no known allergies.   Social History   Tobacco Use  . Smoking status: Never Smoker  . Smokeless tobacco: Never Used  Vaping Use  . Vaping Use: Never used  Substance Use Topics  . Alcohol use: Never  . Drug use: Not Currently     Family Hx: The patient's family history includes Diabetes in his father; Heart disease (age of onset: 23) in his father.  ROS:   Please see the history of present illness.     All other systems reviewed and are negative.   Labs/Other Tests and Data Reviewed:    Recent Labs: 08/18/2019: ALT 23; B Natriuretic Peptide 378.0; BUN 24; Creatinine, Ser 1.54; Hemoglobin 17.6; Platelets 127; Potassium 4.5; Sodium 132   Recent Lipid Panel No results found for: CHOL, TRIG, HDL, CHOLHDL, LDLCALC, LDLDIRECT  Wt Readings from Last 3 Encounters:  04/28/20 184 lb (83.5 kg)  04/06/20 185 lb (83.9 kg)  12/07/19 188 lb (85.3 kg)     Objective:    Vital Signs:  BP 114/82   Pulse 79   Ht 6' (1.829 m)   Wt 184 lb (83.5 kg)   BMI 24.95 kg/m    ASSESSMENT & PLAN:    1.  OSA - The pathophysiology of obstructive sleep apnea , it's cardiovascular consequences & modes of treatment including CPAP were discused with the patient in detail & they evidenced understanding.  The patient is tolerating PAP therapy well without any problems. The PAP download was reviewed today and showed an AHI of 23.7/hr on auto PAP cm H2O with 80% compliance in  using more than 4 hours nightly.  The patient has been using and benefiting from PAP use and will continue to benefit from therapy.  -His AHI is too high and he is not tolerating the higher pressure -He has already set up for a BIPAP titration next month   COVID-19 Education: The signs and symptoms of COVID-19 were discussed with the patient and how to seek care for testing (follow up with PCP or arrange E-visit).  The importance of social distancing was discussed today.  Patient  Risk:   After full review of this patient's clinical status, I feel that they are at least moderate risk at this time.  Time:   Today, I have spent 20 minutes on telemedicine discussing medical problems including OSA and reviewing patient's chart including home sleep study and PAP compliance download.  Medication Adjustments/Labs and Tests Ordered: Current medicines are reviewed at length with the patient today.  Concerns regarding medicines are outlined above.  Tests Ordered: No orders of the defined types were placed in this encounter.  Medication Changes: No orders of the defined types were placed in this encounter.   Disposition:  Follow up 8 weeks after BiPAP titration  Signed, Armanda Magic, MD  04/28/2020 9:43 AM    [facility]

## 2020-04-28 NOTE — Telephone Encounter (Signed)
°  Patient Consent for Virtual Visit         [name] has provided verbal consent on 04/28/2020 for a virtual visit (video or telephone).   CONSENT FOR VIRTUAL VISIT FOR:  Antonio Curtis  By participating in this virtual visit I agree to the following:  I hereby voluntarily request, consent and authorize [facility] and its employed or contracted physicians, physician assistants, nurse practitioners or other licensed health care professionals (the Practitioner), to provide me with telemedicine health care services (the Services") as deemed necessary by the treating Practitioner. I acknowledge and consent to receive the Services by the Practitioner via telemedicine. I understand that the telemedicine visit will involve communicating with the Practitioner through live audiovisual communication technology and the disclosure of certain medical information by electronic transmission. I acknowledge that I have been given the opportunity to request an in-person assessment or other available alternative prior to the telemedicine visit and am voluntarily participating in the telemedicine visit.  I understand that I have the right to withhold or withdraw my consent to the use of telemedicine in the course of my care at any time, without affecting my right to future care or treatment, and that the Practitioner or I may terminate the telemedicine visit at any time. I understand that I have the right to inspect all information obtained and/or recorded in the course of the telemedicine visit and may receive copies of available information for a reasonable fee.  I understand that some of the potential risks of receiving the Services via telemedicine include:   Delay or interruption in medical evaluation due to technological equipment failure or disruption;  Information transmitted may not be sufficient (e.g. poor resolution of images) to allow for appropriate medical decision making by the Practitioner;  and/or   In rare instances, security protocols could fail, causing a breach of personal health information.  Furthermore, I acknowledge that it is my responsibility to provide information about my medical history, conditions and care that is complete and accurate to the best of my ability. I acknowledge that Practitioner's advice, recommendations, and/or decision may be based on factors not within their control, such as incomplete or inaccurate data provided by me or distortions of diagnostic images or specimens that may result from electronic transmissions. I understand that the practice of medicine is not an exact science and that Practitioner makes no warranties or guarantees regarding treatment outcomes. I acknowledge that a copy of this consent can be made available to me via my patient portal [facility] MyChart), or I can request a printed copy by calling the office of [facility].    I understand that my insurance will be billed for this visit.   I have read or had this consent read to me.  I understand the contents of this consent, which adequately explains the benefits and risks of the Services being provided via telemedicine.   I have been provided ample opportunity to ask questions regarding this consent and the Services and have had my questions answered to my satisfaction.  I give my informed consent for the services to be provided through the use of telemedicine in my medical care

## 2020-05-02 NOTE — Telephone Encounter (Signed)
Patient is scheduled for BiPAP Titration on 05/28/20 after his ablation on 04/19/20. Patient understands his titration study will be done at [facility] sleep lab. Patient understands he will receive a letter in a week or so detailing appointment, date, time, and location. Patient understands to call if he does not receive the letter  in a timely manner. Patient agrees with treatment and thanked me for call.

## 2020-05-12 ENCOUNTER — Telehealth: Payer: Self-pay

## 2020-05-12 ENCOUNTER — Other Ambulatory Visit: Payer: Self-pay

## 2020-05-12 ENCOUNTER — Telehealth (INDEPENDENT_AMBULATORY_CARE_PROVIDER_SITE_OTHER): Payer: Medicare Other | Admitting: Internal Medicine

## 2020-05-12 VITALS — BP 110/72 | HR 75 | Ht 72.0 in | Wt 185.0 lb

## 2020-05-12 DIAGNOSIS — I495 Sick sinus syndrome: Secondary | ICD-10-CM

## 2020-05-12 DIAGNOSIS — R55 Syncope and collapse: Secondary | ICD-10-CM

## 2020-05-12 DIAGNOSIS — Z950 Presence of cardiac pacemaker: Secondary | ICD-10-CM

## 2020-05-12 DIAGNOSIS — I480 Paroxysmal atrial fibrillation: Secondary | ICD-10-CM

## 2020-05-12 NOTE — Telephone Encounter (Signed)
  Patient Consent for Virtual Visit         Antonio Curtis has provided verbal consent on 05/12/2020 for a virtual visit (video or telephone).   CONSENT FOR VIRTUAL VISIT FOR:  Antonio Curtis  By participating in this virtual visit I agree to the following:  I hereby voluntarily request, consent and authorize [facility] and its employed or contracted physicians, physician assistants, nurse practitioners or other licensed health care professionals (the Practitioner), to provide me with telemedicine health care services (the "Services") as deemed necessary by the treating Practitioner. I acknowledge and consent to receive the Services by the Practitioner via telemedicine. I understand that the telemedicine visit will involve communicating with the Practitioner through live audiovisual communication technology and the disclosure of certain medical information by electronic transmission. I acknowledge that I have been given the opportunity to request an in-person assessment or other available alternative prior to the telemedicine visit and am voluntarily participating in the telemedicine visit.  I understand that I have the right to withhold or withdraw my consent to the use of telemedicine in the course of my care at any time, without affecting my right to future care or treatment, and that the Practitioner or I may terminate the telemedicine visit at any time. I understand that I have the right to inspect all information obtained and/or recorded in the course of the telemedicine visit and may receive copies of available information for a reasonable fee.  I understand that some of the potential risks of receiving the Services via telemedicine include:  Marland Kitchen Delay or interruption in medical evaluation due to technological equipment failure or disruption; . Information transmitted may not be sufficient (e.g. poor resolution of images) to allow for appropriate medical decision making by the Practitioner;  and/or  . In rare instances, security protocols could fail, causing a breach of personal health information.  Furthermore, I acknowledge that it is my responsibility to provide information about my medical history, conditions and care that is complete and accurate to the best of my ability. I acknowledge that Practitioner's advice, recommendations, and/or decision may be based on factors not within their control, such as incomplete or inaccurate data provided by me or distortions of diagnostic images or specimens that may result from electronic transmissions. I understand that the practice of medicine is not an exact science and that Practitioner makes no warranties or guarantees regarding treatment outcomes. I acknowledge that a copy of this consent can be made available to me via my patient portal [facility] MyChart), or I can request a printed copy by calling the office of [facility].    I understand that my insurance will be billed for this visit.   I have read or had this consent read to me. . I understand the contents of this consent, which adequately explains the benefits and risks of the Services being provided via telemedicine.  . I have been provided ample opportunity to ask questions regarding this consent and the Services and have had my questions answered to my satisfaction. . I give my informed consent for the services to be provided through the use of telemedicine in my medical care

## 2020-05-12 NOTE — Patient Instructions (Signed)

## 2020-05-12 NOTE — Progress Notes (Signed)
Electrophysiology TeleHealth Note   Due to national recommendations of social distancing due to COVID 19, an audio/video telehealth visit is felt to be most appropriate for this patient at this time.  See MyChart message from today for the patient's consent to telehealth for [facility].   Date:  05/12/2020   ID:  Antonio Curtis, DOB February 25, 1941, MRN [id number]  Location: patient's home  Provider location: [address] [place]  Evaluation Performed: Follow-up visit  PCP:  Marden Noble, MD  Cardiologist:  [facility] Electrophysiologist:  SK   Chief Complaint: doing fair  History of Present Illness:    Antonio Curtis is a 79 y.o. male who presents via audio/video conferencing for a telehealth visit today.  Since last being seen in our clinic to establish pacemaker-Biotronik follow-up with increasingly problematic atrial fibrillation for which he was referred to Dr. Fawn Kirk for consideration of ablation scheduled 9/17 the patient reports persistence of afib with dyspnea, lack of energy No bleeding   DATE TEST EF   8/21 Echo   60.65 % LAE "mild"        Date Cr K Hgb  8/21 1.4 4.4 16.0            Thromboembolic risk factors ( age -34) for a CHADSVASc Score of2 The patient denies symptoms of fevers, chills, cough, or new SOB worrisome for COVID 19.    Past Medical History:  Diagnosis Date  . H/O partial resection of colon   . Hypothyroid   . 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 Paroxysmal atrial fibrillation (HCC)   . Sick sinus syndrome [facility])     Past Surgical History:  Procedure Laterality Date  . ACHILLES TENDON REPAIR    . APPENDECTOMY    . COLON SURGERY     Hemicolectomy:  Polyp.    Marland Kitchen PACEMAKER IMPLANT     Biotronik PPM implanted in [place] by Dr [name].  . TONSILLECTOMY      Current Outpatient Medications  Medication Sig Dispense Refill  . Cholecalciferol (VITAMIN D-3) 125 MCG (5000 UT) TABS Take by mouth.    Everlene Balls 5 MG TABS tablet TAKE  1 TABLET BY MOUTH EACH MORNING AND EVENING AS DIRECTED (NEEDS TO SCHEDULE F/U APPT) 180 tablet 1  . levothyroxine (SYNTHROID) 50 MCG tablet Take 70 mcg by mouth daily before breakfast.     . metoprolol tartrate 37.5 MG TABS Take 37.5 mg by mouth 2 (two) times daily. 180 tablet 3  . propafenone (RYTHMOL) 225 MG tablet Take 1 tablet (225 mg total) by mouth every 8 (eight) hours. 270 tablet 3  . metoprolol tartrate (LOPRESSOR) 50 MG tablet Take 1 tablet (50 mg total) by mouth as directed. (Patient not taking: Reported on 05/12/2020) 1 tablet 0   No current facility-administered medications for this visit.    Allergies:   Patient has no known allergies.   Social History:  The patient  reports that he has never smoked. He has never used smokeless tobacco. He reports previous drug use. He reports that he does not drink alcohol.   Family History:  The patient's   family history includes Diabetes in his father; Heart disease (age of onset: 33) in his father.   ROS:  Please see the history of present illness.   All other systems are personally reviewed and negative.    Exam:    Vital Signs:  BP 110/72   Pulse 75   Ht 6' (1.829 m)  Wt 185 lb (83.9 kg)   BMI 25.09 kg/m     Labs/Other Tests and Data Reviewed:    Recent Labs: 08/18/2019: ALT 23; B Natriuretic Peptide 378.0 04/28/2020: BUN 14; Creatinine, Ser 1.40; Hemoglobin 16.0; Platelets 175; Potassium 4.4; Sodium 138   Wt Readings from Last 3 Encounters:  05/12/20 185 lb (83.9 kg)  04/28/20 184 lb (83.5 kg)  04/06/20 185 lb (83.9 kg)     Other studies personally reviewed: Additional studies/ records that were reviewed today include:notes  Last device remote is reviewed from [employer] PDF dated *8/21* which reveals normal device function,   arrhythmias -increasing burden of atrial fibrillation looking approximately 75+ percent of the time since June   ASSESSMENT & PLAN:   Atrial fibrillation-persistent  Syncope  Sinus node  dysfunction  1AV block-significant  Pacemaker Biotronik  Persistent afib   For RFCA next week, would stop propafenone, but will defer to JA  To whom I have reached out  On Anticoagulation;  No bleeding issues     COVID 19 screen The patient denies symptoms of COVID 19 at this time.  The importance of social distancing was discussed today.  Follow-up: 19m Next remote: As Scheduled   Current medicines are reviewed at length with the patient today.   The patient does not have concerns regarding his medicines.  The following changes were made today:  None but await response from [facility] regarding stopping propafenone  Labs/ tests ordered today include:  No orders of the defined types were placed in this encounter.      Patient Risk:  after full review of this patients clinical status, I feel that they are at moderate risk at this time.  Today, I have spent 8* minutes with the patient with telehealth technology discussing the above.  Signed, Sherryl Manges, MD  05/12/2020 2:29 PM     [facility] [address] [place] [address] [phone] (office) [phone] (fax)

## 2020-05-13 ENCOUNTER — Telehealth (HOSPITAL_COMMUNITY): Payer: Self-pay | Admitting: Emergency Medicine

## 2020-05-13 ENCOUNTER — Other Ambulatory Visit (HOSPITAL_COMMUNITY): Payer: Medicare Other

## 2020-05-13 NOTE — Telephone Encounter (Signed)
Reaching out to patient to offer assistance regarding upcoming cardiac imaging study; pt verbalizes understanding of appt date/time, parking situation and where to check in, pre-test NPO status and medications ordered, and verified current allergies; name and call back number provided for further questions should they arise Jorgen Wolfinger RN Navigator Cardiac Imaging [facility] [phone] office [phone] cell 

## 2020-05-16 ENCOUNTER — Other Ambulatory Visit: Payer: Self-pay

## 2020-05-16 ENCOUNTER — Ambulatory Visit (HOSPITAL_COMMUNITY)
Admission: RE | Admit: 2020-05-16 | Discharge: 2020-05-16 | Disposition: A | Discharge status: Home / Self Care | Payer: Medicare Other | Source: Ambulatory Visit | Attending: Internal Medicine | Admitting: Internal Medicine

## 2020-05-16 DIAGNOSIS — I4891 Unspecified atrial fibrillation: Secondary | ICD-10-CM | POA: Insufficient documentation

## 2020-05-16 MED ORDER — METOPROLOL TARTRATE 5 MG/5ML IV SOLN
INTRAVENOUS | Status: AC
  Administered 2020-05-16: 5 mg
  Filled 2020-05-16: qty 5

## 2020-05-16 MED ORDER — IOHEXOL 350 MG/ML SOLN
80.0000 mL | Freq: Once | INTRAVENOUS | Status: AC | PRN
  Administered 2020-05-16: 80 mL via INTRAVENOUS

## 2020-05-18 ENCOUNTER — Other Ambulatory Visit (HOSPITAL_COMMUNITY): Payer: Self-pay

## 2020-05-18 ENCOUNTER — Other Ambulatory Visit (HOSPITAL_COMMUNITY)
Admission: RE | Admit: 2020-05-18 | Discharge: 2020-05-18 | Disposition: A | Discharge status: Home / Self Care | Payer: Medicare Other | Source: Ambulatory Visit | Attending: Internal Medicine | Admitting: Internal Medicine

## 2020-05-18 DIAGNOSIS — Z20822 Contact with and (suspected) exposure to covid-19: Secondary | ICD-10-CM | POA: Insufficient documentation

## 2020-05-18 DIAGNOSIS — Z01812 Encounter for preprocedural laboratory examination: Secondary | ICD-10-CM | POA: Insufficient documentation

## 2020-05-18 LAB — SARS CORONAVIRUS 2 (TAT 6-24 HRS): SARS Coronavirus 2: NEGATIVE

## 2020-05-19 NOTE — Anesthesia Preprocedure Evaluation (Addendum)
Anesthesia Evaluation  Patient identified by MRN, date of birth, ID band Patient awake    Reviewed: Allergy & Precautions, NPO status , Patient's Chart, lab work & pertinent test results  Airway Mallampati: II  TM Distance: >3 FB Neck ROM: Full    Dental  (+) Teeth Intact, Dental Advisory Given, Caps   Pulmonary neg pulmonary ROS,    Pulmonary exam normal breath sounds clear to auscultation       Cardiovascular Normal cardiovascular exam+ dysrhythmias (eliquis, metoprolol, propafenone- took all meds last yesterday ) Atrial Fibrillation + pacemaker (sick sinus)  Rhythm:Regular Rate:Normal  Echo 04/2020: 1. Left ventricular ejection fraction, by estimation, is 60 to 65%. The  left ventricle has normal function. The left ventricle has no regional  wall motion abnormalities. Left ventricular diastolic parameters are  indeterminate.  2. Right ventricular systolic function is normal. The right ventricular  size is normal. There is normal pulmonary artery systolic pressure.  3. Left atrial size was mildly dilated.  4. The mitral valve is normal in structure. Trivial mitral valve  regurgitation. No evidence of mitral stenosis.  5. The aortic valve is tricuspid. Aortic valve regurgitation is not  visualized. No aortic stenosis is present.  6. Aortic dilatation noted. Aneurysm of the ascending aorta, measuring 37  mm. There is mild dilatation at the level of the sinuses of Valsalva  measuring 38 mm.  7. The inferior vena cava is normal in size with greater than 50%  respiratory variability, suggesting right atrial pressure of 3 mmHg.   Stress test 2019: mild fixed defect inferior wall   Neuro/Psych negative neurological ROS  negative psych ROS   GI/Hepatic negative GI ROS, Neg liver ROS,   Endo/Other  Hypothyroidism   Renal/GU negative Renal ROS  negative genitourinary   Musculoskeletal negative musculoskeletal ROS (+)    Abdominal   Peds  Hematology negative hematology ROS (+)   Anesthesia Other Findings   Reproductive/Obstetrics negative OB ROS                            Anesthesia Physical Anesthesia Plan  ASA: III  Anesthesia Plan: General   Post-op Pain Management:    Induction: Intravenous  PONV Risk Score and Plan: 2 and Ondansetron, Dexamethasone and Treatment may vary due to age or medical condition  Airway Management Planned: Oral ETT  Additional Equipment: None  Intra-op Plan:   Post-operative Plan: Extubation in OR  Informed Consent: I have reviewed the patients History and Physical, chart, labs and discussed the procedure including the risks, benefits and alternatives for the proposed anesthesia with the patient or authorized representative who has indicated his/her understanding and acceptance.     Dental advisory given  Plan Discussed with: CRNA  Anesthesia Plan Comments:        Anesthesia Quick Evaluation

## 2020-05-19 NOTE — Progress Notes (Signed)
Instructed patient on the following items: Arrival time 0530 Nothing to eat or drink after midnight No meds AM of procedure Responsible person to drive you home and stay with you for 24 hrs  Have you missed any doses of anti-coagulant Eliquis- no does missed

## 2020-05-20 ENCOUNTER — Other Ambulatory Visit: Payer: Self-pay

## 2020-05-20 ENCOUNTER — Ambulatory Visit (HOSPITAL_COMMUNITY)
Admission: RE | Admit: 2020-05-20 | Discharge: 2020-05-20 | Disposition: A | Discharge status: Home / Self Care | Payer: Medicare Other | Attending: Internal Medicine | Admitting: Internal Medicine

## 2020-05-20 ENCOUNTER — Encounter (HOSPITAL_COMMUNITY)
Admission: RE | Disposition: A | Discharge status: Home / Self Care | Payer: Medicare Other | Source: Home / Self Care | Attending: Internal Medicine

## 2020-05-20 ENCOUNTER — Ambulatory Visit (HOSPITAL_COMMUNITY): Payer: Medicare Other | Admitting: Certified Registered Nurse Anesthetist

## 2020-05-20 DIAGNOSIS — I4819 Other persistent atrial fibrillation: Secondary | ICD-10-CM | POA: Insufficient documentation

## 2020-05-20 DIAGNOSIS — Z9049 Acquired absence of other specified parts of digestive tract: Secondary | ICD-10-CM | POA: Insufficient documentation

## 2020-05-20 DIAGNOSIS — Z7901 Long term (current) use of anticoagulants: Secondary | ICD-10-CM | POA: Diagnosis not present

## 2020-05-20 DIAGNOSIS — E039 Hypothyroidism, unspecified: Secondary | ICD-10-CM | POA: Insufficient documentation

## 2020-05-20 DIAGNOSIS — I495 Sick sinus syndrome: Secondary | ICD-10-CM | POA: Insufficient documentation

## 2020-05-20 DIAGNOSIS — Z950 Presence of cardiac pacemaker: Secondary | ICD-10-CM | POA: Insufficient documentation

## 2020-05-20 DIAGNOSIS — Z79890 Hormone replacement therapy (postmenopausal): Secondary | ICD-10-CM | POA: Insufficient documentation

## 2020-05-20 DIAGNOSIS — Z79899 Other long term (current) drug therapy: Secondary | ICD-10-CM | POA: Diagnosis not present

## 2020-05-20 HISTORY — PX: ATRIAL FIBRILLATION ABLATION: EP1191

## 2020-05-20 LAB — POCT ACTIVATED CLOTTING TIME
Activated Clotting Time: 329 seconds
Activated Clotting Time: 340 seconds

## 2020-05-20 SURGERY — ATRIAL FIBRILLATION ABLATION
Anesthesia: General

## 2020-05-20 MED ORDER — LACTATED RINGERS IV SOLN: INTRAVENOUS | Status: DC | PRN

## 2020-05-20 MED ORDER — SUGAMMADEX SODIUM 200 MG/2ML IV SOLN
INTRAVENOUS | Status: DC | PRN
  Administered 2020-05-20: 200 mg via INTRAVENOUS

## 2020-05-20 MED ORDER — PHENYLEPHRINE 40 MCG/ML (10ML) SYRINGE FOR IV PUSH (FOR BLOOD PRESSURE SUPPORT)
20.0000 ug | PREFILLED_SYRINGE | INTRAVENOUS | Status: DC | PRN
  Administered 2020-05-20 (×2): 20 ug via INTRAVENOUS

## 2020-05-20 MED ORDER — LIDOCAINE 2% (20 MG/ML) 5 ML SYRINGE
INTRAMUSCULAR | Status: DC | PRN
  Administered 2020-05-20: 20 mg via INTRAVENOUS

## 2020-05-20 MED ORDER — SODIUM CHLORIDE 0.9 % IV BOLUS
250.0000 mL | Freq: Once | INTRAVENOUS | Status: AC
  Administered 2020-05-20: 250 mL via INTRAVENOUS

## 2020-05-20 MED ORDER — HEPARIN SODIUM (PORCINE) 1000 UNIT/ML IJ SOLN
INTRAMUSCULAR | Status: AC
  Filled 2020-05-20: qty 1

## 2020-05-20 MED ORDER — HEPARIN (PORCINE) IN NACL 1000-0.9 UT/500ML-% IV SOLN
INTRAVENOUS | Status: DC | PRN
  Administered 2020-05-20: 500 mL

## 2020-05-20 MED ORDER — HEPARIN SODIUM (PORCINE) 1000 UNIT/ML IJ SOLN
INTRAMUSCULAR | Status: DC | PRN
  Administered 2020-05-20: 14000 [IU] via INTRAVENOUS
  Administered 2020-05-20: 1000 [IU] via INTRAVENOUS

## 2020-05-20 MED ORDER — PROTAMINE SULFATE 10 MG/ML IV SOLN
INTRAVENOUS | Status: DC | PRN
  Administered 2020-05-20: 30 mg via INTRAVENOUS

## 2020-05-20 MED ORDER — HEPARIN SODIUM (PORCINE) 1000 UNIT/ML IJ SOLN
INTRAMUSCULAR | Status: DC | PRN
  Administered 2020-05-20: 1000 [IU] via INTRAVENOUS
  Administered 2020-05-20: 2000 [IU] via INTRAVENOUS

## 2020-05-20 MED ORDER — PHENYLEPHRINE 40 MCG/ML (10ML) SYRINGE FOR IV PUSH (FOR BLOOD PRESSURE SUPPORT)
PREFILLED_SYRINGE | INTRAVENOUS | Status: AC
  Filled 2020-05-20: qty 10

## 2020-05-20 MED ORDER — SODIUM CHLORIDE 0.9% FLUSH: 3.0000 mL | Freq: Two times a day (BID) | INTRAVENOUS | Status: DC

## 2020-05-20 MED ORDER — HYDROCODONE-ACETAMINOPHEN 5-325 MG PO TABS: 1.0000 | ORAL_TABLET | ORAL | Status: DC | PRN

## 2020-05-20 MED ORDER — PHENYLEPHRINE 40 MCG/ML (10ML) SYRINGE FOR IV PUSH (FOR BLOOD PRESSURE SUPPORT)
PREFILLED_SYRINGE | INTRAVENOUS | Status: DC | PRN
  Administered 2020-05-20: 40 ug via INTRAVENOUS

## 2020-05-20 MED ORDER — SODIUM CHLORIDE 0.9 % IV SOLN: INTRAVENOUS | Status: DC

## 2020-05-20 MED ORDER — PROPOFOL 10 MG/ML IV BOLUS
INTRAVENOUS | Status: DC | PRN
  Administered 2020-05-20: 100 mg via INTRAVENOUS
  Administered 2020-05-20: 50 mg via INTRAVENOUS

## 2020-05-20 MED ORDER — ACETAMINOPHEN 325 MG PO TABS: 650.0000 mg | ORAL_TABLET | ORAL | Status: DC | PRN

## 2020-05-20 MED ORDER — ONDANSETRON HCL 4 MG/2ML IJ SOLN: 4.0000 mg | Freq: Four times a day (QID) | INTRAMUSCULAR | Status: DC | PRN

## 2020-05-20 MED ORDER — PANTOPRAZOLE SODIUM 40 MG PO TBEC: 40.0000 mg | DELAYED_RELEASE_TABLET | Freq: Every day | ORAL | 0 refills | Status: DC

## 2020-05-20 MED ORDER — ROCURONIUM BROMIDE 10 MG/ML (PF) SYRINGE
PREFILLED_SYRINGE | INTRAVENOUS | Status: DC | PRN
  Administered 2020-05-20: 50 mg via INTRAVENOUS

## 2020-05-20 MED ORDER — SODIUM CHLORIDE 0.9% FLUSH: 3.0000 mL | INTRAVENOUS | Status: DC | PRN

## 2020-05-20 MED ORDER — SODIUM CHLORIDE 0.9 % IV SOLN: 250.0000 mL | INTRAVENOUS | Status: DC | PRN

## 2020-05-20 MED ORDER — DEXAMETHASONE SODIUM PHOSPHATE 10 MG/ML IJ SOLN
INTRAMUSCULAR | Status: DC | PRN
  Administered 2020-05-20: 4 mg via INTRAVENOUS

## 2020-05-20 MED ORDER — APIXABAN 5 MG PO TABS
5.0000 mg | ORAL_TABLET | ORAL | Status: AC
  Administered 2020-05-20: 5 mg via ORAL
  Filled 2020-05-20: qty 1

## 2020-05-20 MED ORDER — PHENYLEPHRINE HCL-NACL 10-0.9 MG/250ML-% IV SOLN
INTRAVENOUS | Status: DC | PRN
  Administered 2020-05-20: 40 ug/min via INTRAVENOUS

## 2020-05-20 MED ORDER — ONDANSETRON HCL 4 MG/2ML IJ SOLN
INTRAMUSCULAR | Status: DC | PRN
  Administered 2020-05-20: 4 mg via INTRAVENOUS

## 2020-05-20 SURGICAL SUPPLY — 19 items
BLANKET WARM UNDERBOD FULL ACC (MISCELLANEOUS) ×2 IMPLANT
CATH MAPPNG PENTARAY F 2-6-2MM (CATHETERS) ×1 IMPLANT
CATH SMTCH THERMOCOOL SF DF (CATHETERS) ×2 IMPLANT
CATH SOUNDSTAR ECO 8FR (CATHETERS) ×2 IMPLANT
CATH WEBSTER BI DIR CS D-F CRV (CATHETERS) ×2 IMPLANT
COVER SWIFTLINK CONNECTOR (BAG) ×2 IMPLANT
DEVICE CLOSURE PERCLS PRGLD 6F (VASCULAR PRODUCTS) ×3 IMPLANT
NEEDLE BAYLIS TRANSSEPTAL 71CM (NEEDLE) ×2 IMPLANT
PACK EP LATEX FREE (CUSTOM PROCEDURE TRAY) ×2
PACK EP LF (CUSTOM PROCEDURE TRAY) ×1 IMPLANT
PAD PRO RADIOLUCENT 2001M-C (PAD) ×2 IMPLANT
PATCH CARTO3 (PAD) ×2 IMPLANT
PENTARAY F 2-6-2MM (CATHETERS) ×2
PERCLOSE PROGLIDE 6F (VASCULAR PRODUCTS) ×6
SHEATH PINNACLE 7F 10CM (SHEATH) ×4 IMPLANT
SHEATH PINNACLE 9F 10CM (SHEATH) ×2 IMPLANT
SHEATH PROBE COVER 6X72 (BAG) ×2 IMPLANT
SHEATH SWARTZ TS SL2 63CM 8.5F (SHEATH) ×2 IMPLANT
TUBING SMART ABLATE COOLFLOW (TUBING) ×2 IMPLANT

## 2020-05-20 NOTE — Progress Notes (Signed)
Dr Johney Frame at bedside, Biotronik rep at bedside. Pt hypotensive, asymptomatic. Bedside echo done. New orders see MAR. Will continue to monitor.

## 2020-05-20 NOTE — H&P (Signed)
Chief Complaint:  palpitations  History of Present Illness:    Antonio Curtis is a 79 y.o. male who presents for afib ablation. Since last being seen in our clinic, the patient reports doing reasonably well.  His afib has progressed.  He thinks that he is in afib most of the time.  + SOB and dizziness.  + fatigue.  Today, he denies symptoms of palpitations, chest pain, lower extremity edema, presyncope, or syncope.  The patient is otherwise without complaint today.         Past Medical History:  Diagnosis Date  . H/O partial resection of colon   . Hypothyroid   . 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 Paroxysmal atrial fibrillation (HCC)   . Sick sinus syndrome [facility])          Past Surgical History:  Procedure Laterality Date  . ACHILLES TENDON REPAIR    . APPENDECTOMY    . COLON SURGERY     Hemicolectomy:  Polyp.    Marland Kitchen PACEMAKER IMPLANT     Biotronik PPM implanted in [place] by Dr [name].  . TONSILLECTOMY            Current Outpatient Medications  Medication Sig Dispense Refill  . ELIQUIS 5 MG TABS tablet TAKE 1 TABLET BY MOUTH EACH MORNING AND EVENING AS DIRECTED (NEEDS TO SCHEDULE F/U APPT) 60 tablet 1  . levothyroxine (SYNTHROID) 50 MCG tablet Take 50 mcg by mouth daily before breakfast.    . metoprolol tartrate 37.5 MG TABS Take 37.5 mg by mouth 2 (two) times daily. 180 tablet 3  . propafenone (RYTHMOL) 225 MG tablet Take 1 tablet (225 mg total) by mouth every 8 (eight) hours. 270 tablet 3   No current facility-administered medications for this visit.    Allergies:   Patient has no known allergies.   Social History:  The patient  reports that he has never smoked. He has never used smokeless tobacco. He reports previous drug use. He reports that he does not drink alcohol.   ROS:  Please see the history of present illness.   All other systems are personally reviewed and negative.   Physical Exam: Vitals:   05/20/20 0551 05/20/20 0555   BP:  (!) 157/95  Pulse:  91  Resp:  17  Temp:  97.8 F (36.6 C)  TempSrc:  Oral  SpO2:  100%  Weight: 83.9 kg 83.9 kg  Height: 6' (1.829 m) 6' (1.829 m)    GEN- The patient is well appearing, alert and oriented x 3 today.   Head- normocephalic, atraumatic Eyes-  Sclera clear, conjunctiva pink Ears- hearing intact Oropharynx- clear Neck- supple, Lungs-   normal work of breathing Heart- irregular rate and rhythm  GI- soft, NT, ND, + BS Extremities- no clubbing, cyanosis, or edema   Labs/Other Tests and Data Reviewed:    Recent Labs: 08/18/2019: ALT 23; B Natriuretic Peptide 378.0; BUN 24; Creatinine, Ser 1.54; Hemoglobin 17.6; Platelets 127; Potassium 4.5; Sodium 132      Wt Readings from Last 3 Encounters:  04/06/20 185 lb (83.9 kg)  12/07/19 188 lb (85.3 kg)  11/20/19 186 lb (84.4 kg)      ASSESSMENT & PLAN:    1.  Persistent atrial fibrillation The patient has symptomatic, recurrent persistent atrial fibrillation. he has failed medical therapy with flecainide and propanone. Chads2vasc score is 2.  he is anticoagulated with eliquis . Risk, benefits, and alternatives to EP study and radiofrequency ablation for afib  were also discussed in detail today. These risks include but are not limited to stroke, bleeding, vascular damage, tamponade, perforation, damage to the esophagus, lungs, and other structures, pulmonary vein stenosis, worsening renal function, and death. The patient understands these risk and wishes to proceed.   His cardiac CT was reviewed with him at length today.  He reports compliance with eliquis without interruption.  [name] MD, [facility] 05/20/2020 7:36 AM

## 2020-05-20 NOTE — Discharge Instructions (Signed)
Post procedure care instructions No driving for 4 days. No lifting over 5 lbs for 1 week. No vigorous or sexual activity for 1 week. You may return to work/your usual activities on 05/27/20. Keep procedure site clean & dry. If you notice increased pain, swelling, bleeding or pus, call/return!  You may shower, but no soaking baths/hot tubs/pools for 1 week.   You have an appointment set up with the Atrial Fibrillation Clinic.  Multiple studies have shown that being followed by a dedicated atrial fibrillation clinic in addition to the standard care you receive from your other physicians improves health. We believe that enrollment in the atrial fibrillation clinic will allow [place] to better care for you.   The phone number to the Atrial Fibrillation Clinic is [phone]. The clinic is staffed Monday through Friday from 8:30am to 5pm.  Parking Directions: The clinic is located in the Heart and Vascular Building connected to [facility]. 1)From [address] turn on to [address] and go to the 3rd entrance  (Heart and Vascular entrance) on the right. 2)Look to the right for Heart &Vascular Parking Garage. 3)A code for the entrance is required for October is 3009  4)Take the elevators to the 1st floor. Registration is in the room with the glass walls at the end of the hallway.  If you have any trouble parking or locating the clinic, please don't hesitate to call [phone].

## 2020-05-20 NOTE — Transfer of Care (Signed)
Immediate Anesthesia Transfer of Care Note  Patient: Antonio Curtis  Procedure(s) Performed: ATRIAL FIBRILLATION ABLATION (N/A )  Patient Location: PACU  Anesthesia Type:General  Level of Consciousness: drowsy  Airway & Oxygen Therapy: Patient Spontanous Breathing and Patient connected to nasal cannula oxygen  Post-op Assessment: Report given to RN and Post -op Vital signs reviewed and stable  Post vital signs: Reviewed and stable  Last Vitals:  Vitals Value Taken Time  BP 93/55 05/20/20 1017  Temp 36.3 C 05/20/20 1018  Pulse 82 05/20/20 1019  Resp 15 05/20/20 1019  SpO2 100 % 05/20/20 1019  Vitals shown include unvalidated device data.  Last Pain:  Vitals:   05/20/20 1018  TempSrc: Temporal  PainSc: Asleep         Complications: No complications documented.

## 2020-05-20 NOTE — Anesthesia Postprocedure Evaluation (Signed)
Anesthesia Post Note  Patient: Antonio Curtis  Procedure(s) Performed: ATRIAL FIBRILLATION ABLATION (N/A )     Patient location during evaluation: PACU Anesthesia Type: General Level of consciousness: awake and alert, oriented and patient cooperative Pain management: pain level controlled Vital Signs Assessment: post-procedure vital signs reviewed and stable Respiratory status: spontaneous breathing, nonlabored ventilation and respiratory function stable Cardiovascular status: blood pressure returned to baseline and stable Postop Assessment: no apparent nausea or vomiting Anesthetic complications: no   No complications documented.  Last Vitals:  Vitals:   05/20/20 1125 05/20/20 1130  BP: (!) 110/51 (!) 116/54  Pulse: 85 74  Resp: 18 14  Temp:    SpO2: 97% 97%    Last Pain:  Vitals:   05/20/20 1112  TempSrc:   PainSc: 0-No pain                 Lannie Fields

## 2020-05-20 NOTE — Anesthesia Procedure Notes (Signed)
Procedure Name: Intubation Performed by: Ezekiel Ina, CRNA Pre-anesthesia Checklist: Patient identified, Emergency Drugs available, Suction available and Patient being monitored Patient Re-evaluated:Patient Re-evaluated prior to induction Oxygen Delivery Method: Circle System Utilized Preoxygenation: Pre-oxygenation with 100% oxygen Induction Type: IV induction Ventilation: Mask ventilation without difficulty Laryngoscope Size: Glidescope and 4 Grade View: Grade I Tube type: Oral Tube size: 7.5 mm Number of attempts: 2 Airway Equipment and Method: Stylet Placement Confirmation: ETT inserted through vocal cords under direct vision,  positive ETCO2 and breath sounds checked- equal and bilateral Secured at: 23 cm Tube secured with: Tape Dental Injury: Teeth and Oropharynx as per pre-operative assessment  Difficulty Due To: Difficulty was unanticipated and Difficult Airway- due to anterior larynx Comments: G3 view with Mil2. G1 with Glide4 and successful intubation

## 2020-05-20 NOTE — Anesthesia Postprocedure Evaluation (Signed)
Anesthesia Post Note  Patient: Antonio Curtis  Procedure(s) Performed: ATRIAL FIBRILLATION ABLATION (N/A )     Patient location during evaluation: PACU Anesthesia Type: General Level of consciousness: awake and alert, oriented and patient cooperative Pain management: pain level controlled Vital Signs Assessment: post-procedure vital signs reviewed and stable Respiratory status: spontaneous breathing, nonlabored ventilation and respiratory function stable Cardiovascular status: blood pressure returned to baseline and stable Postop Assessment: no apparent nausea or vomiting Anesthetic complications: no   No complications documented.  Last Vitals:  Vitals:   05/20/20 1047 05/20/20 1052  BP: (!) 77/42 (!) 104/56  Pulse: 80 82  Resp: 14 (!) 25  Temp:    SpO2: 100% 100%    Last Pain:  Vitals:   05/20/20 1018  TempSrc: Temporal  PainSc: Asleep                 Lannie Fields

## 2020-05-22 ENCOUNTER — Encounter (HOSPITAL_BASED_OUTPATIENT_CLINIC_OR_DEPARTMENT_OTHER): Payer: Medicare Other | Admitting: Cardiology

## 2020-05-23 ENCOUNTER — Encounter (HOSPITAL_COMMUNITY): Payer: Self-pay | Admitting: Internal Medicine

## 2020-05-28 ENCOUNTER — Other Ambulatory Visit: Payer: Self-pay

## 2020-05-28 ENCOUNTER — Ambulatory Visit (HOSPITAL_BASED_OUTPATIENT_CLINIC_OR_DEPARTMENT_OTHER): Payer: Medicare Other | Attending: Cardiology | Admitting: Cardiology

## 2020-05-28 DIAGNOSIS — G4733 Obstructive sleep apnea (adult) (pediatric): Secondary | ICD-10-CM | POA: Diagnosis not present

## 2020-05-30 NOTE — Procedures (Signed)
   Patient Name: Antonio Curtis, Kau Date: 05/28/2020 Gender: Male D.O.B: [birth year] Age (years): 76 Referring Provider: Armanda Magic MD, ABSM Height (inches): 72 Interpreting Physician: Armanda Magic MD, ABSM Weight (lbs): 185 RPSGT: Rolene Arbour BMI: 25 MRN: [id number] Neck Size: 17.50  CLINICAL INFORMATION The patient is referred for a BiPAP titration to treat sleep apnea.  SLEEP STUDY TECHNIQUE As per the AASM Manual for the Scoring of Sleep and Associated Events v2.3 (April 2016) with a hypopnea requiring 4% desaturations.  The channels recorded and monitored were frontal, central and occipital EEG, electrooculogram (EOG), submentalis EMG (chin), nasal and oral airflow, thoracic and abdominal wall motion, anterior tibialis EMG, snore microphone, electrocardiogram, and pulse oximetry. Bilevel positive airway pressure (BPAP) was initiated at the beginning of the study and titrated to treat sleep-disordered breathing.  MEDICATIONS Medications self-administered by patient taken the night of the study : LEVOTHYROXINE  RESPIRATORY PARAMETERS Optimal IPAP Pressure (cm): 12  AHI at Optimal Pressure (/hr) 3.2 Optimal EPAP Pressure (cm):8  Overall Minimal O2 (%):89.0  Minimal O2 at Optimal Pressure (%): 91.0  SLEEP ARCHITECTURE Start Time:10:23:21 PM  Stop Time:4:26:05 AM  Total Time (min):362.7  Total Sleep Time (min):218 Sleep Latency (min):12.9  Sleep Efficiency (%):60.1%  REM Latency (min):96.5  WASO (min):131.8 Stage N1 (%): 8.7%  Stage N2 (%): 80.3%  Stage N3 (%): 0.0%  Stage R (%):11 Supine (%):58.26  Arousal Index (/hr):22.0   CARDIAC DATA The 2 lead EKG demonstrated NSR The mean heart rate was 67.1 beats per minute. Other EKG findings include: Intermittent ventricular pacing.  LEG MOVEMENT DATA The total Periodic Limb Movements of Sleep (PLMS) were 0. The PLMS index was 0.0. A PLMS index of <15 is considered normal in adults.  IMPRESSIONS - An optimal  PAP pressure was selected for this patient ( 12/8 cm of water) - Moderate Central Sleep Apnea was noted during this titration (CAI = 16.8/h). - Mild oxygen desaturations were observed during this titration (min O2 = 89.0%). - The patient snored with soft snoring volume. - Intermittent ventricular pacing was observed during this study. - Clinically significant periodic limb movements were not noted during this study. Arousals associated with PLMs were rare.  DIAGNOSIS - Obstructive Sleep Apnea (G47.33)  RECOMMENDATIONS - Trial of BiPAP therapy on 12/8 cm H2O with a Medium size Fisher&Paykel Full Face Mask Simplus mask and heated humidification. - Avoid alcohol, sedatives and other CNS depressants that may worsen sleep apnea and disrupt normal sleep architecture. - Sleep hygiene should be reviewed to assess factors that may improve sleep quality. - Weight management and regular exercise should be initiated or continued. - Return to Sleep Center for re-evaluation after 8 weeks of therapy  [Electronically signed] 05/30/2020 11:07 PM  Armanda Magic MD, Lacey Jensen

## 2020-05-31 ENCOUNTER — Telehealth: Payer: Self-pay | Admitting: *Deleted

## 2020-05-31 NOTE — Telephone Encounter (Addendum)
Informed patient of sleep study results and patient understanding was verbalized. Patient understands his sleep study showed they had a successful PAP titration and let DME know that orders are in EPIC. Please set up 8 week OV with me.   Upon patient request DME selection is [facility].Marland Kitchen Patient understands he will be contacted by [facility] to set up his cpap. Patient understands to call if [facility] does not contact him with new setup in a timely manner. Patient understands they will be called once confirmation has been received from [facility] that they have received their new machine to schedule 10 week follow up appointment.  BN notified of new cpap order  Please add to airview Patient was grateful for the call and thanked me.

## 2020-05-31 NOTE — Telephone Encounter (Signed)
-----   Message from Quintella Reichert, MD sent at 05/30/2020 11:10 PM EDT ----- Please let patient know that they had a successful PAP titration and let DME know that orders are in EPIC.  Please set up 8 week OV with me.

## 2020-06-06 NOTE — Telephone Encounter (Signed)
Order placed to [facility]. 

## 2020-06-16 NOTE — Progress Notes (Signed)
Primary Care Physician: Marden Noble, MD Primary Cardiologist: Dr Antoine Poche  Primary Electrophysiologist: Dr Graciela Husbands Referring Physician: Dr Johney Frame   Antonio Curtis is a 79 y.o. male with a history of SSS s/p PPM, hypothyroid, OSA, and paroxysmal atrial fibrillation who presents for consultation in the [facility]. He reports initially being diagnosed with atrial fibrillation in 2016 after presenting to the ER with numbness down his L side (face, arm and leg) numbness.  He was noted to have afib at the time.  He was also found to be dehydrated at that time. He was placed on flecainide and eliquis.  He did well initially.  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  He had Biotronik PPM implanted by Dr [facility] in [place].  He moved to [facility] 07/2019 and has been followed by Dr Graciela Husbands since that time.  Unfortunately, he began having increasing frequency and duration of atrial fibrillation.  Flecainide was switched to rhythmol, with some improvement in his afib. Not felt to be a good candidate for sotalol or dofetilide with QT prolongation. He is now s/p afib ablation with Dr Johney Frame on 05/20/20. Patient is on Eliquis for a CHADS2VASC score of 2.   On follow up today, patient reports the he feels well after ablation. He does not have the extreme fatigue that he did prior to ablation. His SOB has persistent but this is improving. He denies CP, swallowing, or groin issues. He denies bleeding issues on anticoagulation.   Today, he denies symptoms of palpitations, chest pain, orthopnea, PND, lower extremity edema, dizziness, presyncope, syncope, bleeding, or neurologic sequela. The patient is tolerating medications without difficulties and is otherwise without complaint today.    Atrial Fibrillation Risk Factors:  he does have symptoms or diagnosis of sleep apnea. he is compliant with CPAP therapy. he does not  have a history of rheumatic fever.   he has a BMI of Body mass index is 24.85 kg/m.Marland Kitchen Filed Weights   06/17/20 0931  Weight: 83.1 kg    Family History  Problem Relation Age of Onset  . Heart disease Father 11  . Diabetes Father      Atrial Fibrillation Management history:  Previous antiarrhythmic drugs: flecainide, propafenone  Previous cardioversions: none Previous ablations: 05/20/20 CHADS2VASC score: 2 Anticoagulation history: Eliquis   Past Medical History:  Diagnosis Date  . H/O partial resection of colon   . Hypothyroid   . 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 Paroxysmal atrial fibrillation (HCC)   . Sick sinus syndrome [facility])    Past Surgical History:  Procedure Laterality Date  . ACHILLES TENDON REPAIR    . APPENDECTOMY    . ATRIAL FIBRILLATION ABLATION N/A 05/20/2020   Procedure: ATRIAL FIBRILLATION ABLATION;  Surgeon: [name], MD;  [facility];  Service: Cardiovascular;  Laterality: N/A;  . COLON SURGERY     Hemicolectomy:  Polyp.    Marland Kitchen PACEMAKER IMPLANT     Biotronik PPM implanted in [place] by Dr [name].  . TONSILLECTOMY      Current Outpatient Medications  Medication Sig Dispense Refill  . Cholecalciferol (VITAMIN D-3) 125 MCG (5000 UT) TABS Take 5,000 Units by mouth daily.     . diphenhydrAMINE (BENADRYL) 25 MG tablet Take 25 mg by mouth at bedtime.    Marland Kitchen ELIQUIS 5 MG TABS tablet TAKE 1 TABLET BY MOUTH EACH MORNING AND EVENING AS DIRECTED (NEEDS  TO SCHEDULE F/U APPT) 180 tablet 1  . levothyroxine (SYNTHROID) 75 MCG tablet Take 75 mcg by mouth daily before breakfast.     . metoprolol tartrate (LOPRESSOR) 50 MG tablet Take 37.5 mg by mouth 2 (two) times daily.    . metoprolol tartrate 37.5 MG TABS Take 37.5 mg by mouth 2 (two) times daily. 180 tablet 3  . pantoprazole (PROTONIX) 40 MG tablet Take 1 tablet (40 mg total) by mouth daily. 45 tablet 0  . propafenone (RYTHMOL) 225 MG tablet Take 1 tablet (225 mg total) by mouth every 8  (eight) hours. 270 tablet 3   No current facility-administered medications for this encounter.    No Known Allergies  Social History   Socioeconomic History  . Marital status: Married    Spouse name: Not on file  . Number of children: Not on file  . Years of education: Not on file  . Highest education level: Not on file  Occupational History  . Not on file  Tobacco Use  . Smoking status: Never Smoker  . Smokeless tobacco: Never Used  Vaping Use  . Vaping Use: Never used  Substance and Sexual Activity  . Alcohol use: Yes    Alcohol/week: 1.0 - 2.0 standard drink    Types: 1 - 2 Glasses of wine per week    Comment: monthly  . Drug use: Not Currently  . Sexual activity: Not on file  Other Topics Concern  . Not on file  Social History Narrative   Five children.  7 Grand.  Retired Education officer, community.   Lives in [place] with spouse.   Social Determinants of Health   Financial Resource Strain:   . Difficulty of Paying Living Expenses: Not on file  Food Insecurity:   . Worried About Programme researcher, broadcasting/film/video in the Last Year: Not on file  . Ran Out of Food in the Last Year: Not on file  Transportation Needs:   . Lack of Transportation (Medical): Not on file  . Lack of Transportation (Non-Medical): Not on file  Physical Activity:   . Days of Exercise per Week: Not on file  . Minutes of Exercise per Session: Not on file  Stress:   . Feeling of Stress : Not on file  Social Connections:   . Frequency of Communication with Friends and Family: Not on file  . Frequency of Social Gatherings with Friends and Family: Not on file  . Attends Religious Services: Not on file  . Active Member of Clubs or Organizations: Not on file  . Attends Banker Meetings: Not on file  . Marital Status: Not on file  Intimate Partner Violence:   . Fear of Current or Ex-Partner: Not on file  . Emotionally Abused: Not on file  . Physically Abused: Not on file  . Sexually Abused: Not on file      ROS- All systems are reviewed and negative except as per the HPI above.  Physical Exam: Vitals:   06/17/20 0931  BP: 118/60  Pulse: 84  Weight: 83.1 kg  Height: 6' (1.829 m)    GEN- The patient is well appearing elderly male, alert and oriented x 3 today.   Head- normocephalic, atraumatic Eyes-  Sclera clear, conjunctiva pink Ears- hearing intact Oropharynx- clear Neck- supple  Lungs- Clear to ausculation bilaterally, normal work of breathing Heart- Regular rate and rhythm, no murmurs, rubs or gallops  GI- soft, NT, ND, + BS Extremities- no clubbing, cyanosis, or edema MS- no  significant deformity or atrophy Skin- no rash or lesion Psych- euthymic mood, full affect Neuro- strength and sensation are intact  Wt Readings from Last 3 Encounters:  06/17/20 83.1 kg  05/28/20 83.9 kg  05/20/20 83.9 kg    EKG today demonstrates A paced rhythm HR 84, PR 294, QRS 110, QTc 463  Echo 04/20/20 demonstrated  1. Left ventricular ejection fraction, by estimation, is 60 to 65%. The  left ventricle has normal function. The left ventricle has no regional  wall motion abnormalities. Left ventricular diastolic parameters are  indeterminate.  2. Right ventricular systolic function is normal. The right ventricular  size is normal. There is normal pulmonary artery systolic pressure.  3. Left atrial size was mildly dilated.  4. The mitral valve is normal in structure. Trivial mitral valve  regurgitation. No evidence of mitral stenosis.  5. The aortic valve is tricuspid. Aortic valve regurgitation is not  visualized. No aortic stenosis is present.  6. Aortic dilatation noted. Aneurysm of the ascending aorta, measuring 37  mm. There is mild dilatation at the level of the sinuses of Valsalva  measuring 38 mm.  7. The inferior vena cava is normal in size with greater than 50%  respiratory variability, suggesting right atrial pressure of 3 mmHg.   Epic records are reviewed at  length today  CHA2DS2-VASc Score = 2  The patient's score is based upon: CHF History: 0 HTN History: 0 Diabetes History: 0 Stroke History: 0 Vascular Disease History: 0 Age Score: 2 Gender Score: 0      ASSESSMENT AND PLAN: 1. Paroxysmal Atrial Fibrillation (ICD10:  I48.0) The patient's CHA2DS2-VASc score is 2, indicating a 2.2% annual risk of stroke.   S/p afib ablation with Dr Johney Frame on 05/20/20 Patient appears to be maintaining SR. Will see burden post ablation on scheduled device transmission.  Continue Eliquis 5 mg BID with no missed doses for at least 3 months post ablation. Continue Lopressor 37.5 mg BID  2. Secondary Hypercoagulable State (ICD10:  D68.69) The patient is at significant risk for stroke/thromboembolism based upon his CHA2DS2-VASc Score of 2.  Continue Apixaban (Eliquis).   3. Obstructive sleep apnea The importance of adequate treatment of sleep apnea was discussed today in order to improve our ability to maintain sinus rhythm long term. Patient underwent CPAP titration 05/28/20.  4. SSS S/p PPM, followed by Dr Graciela Husbands and the device clinic.   Follow up with Dr Johney Frame as scheduled.    Jorja Loa PA-C [facility] [address] [place], [place] [address] [phone] 06/17/2020 10:02 AM

## 2020-06-17 ENCOUNTER — Encounter (HOSPITAL_COMMUNITY): Payer: Self-pay | Admitting: Physician Assistant

## 2020-06-17 ENCOUNTER — Ambulatory Visit (HOSPITAL_COMMUNITY)
Admission: RE | Admit: 2020-06-17 | Discharge: 2020-06-17 | Disposition: A | Discharge status: Home / Self Care | Payer: Medicare Other | Source: Ambulatory Visit | Attending: Physician Assistant | Admitting: Physician Assistant

## 2020-06-17 ENCOUNTER — Other Ambulatory Visit: Payer: Self-pay

## 2020-06-17 VITALS — BP 118/60 | HR 84 | Ht 72.0 in | Wt 183.2 lb

## 2020-06-17 DIAGNOSIS — Z950 Presence of cardiac pacemaker: Secondary | ICD-10-CM | POA: Diagnosis not present

## 2020-06-17 DIAGNOSIS — G4733 Obstructive sleep apnea (adult) (pediatric): Secondary | ICD-10-CM | POA: Insufficient documentation

## 2020-06-17 DIAGNOSIS — I480 Paroxysmal atrial fibrillation: Secondary | ICD-10-CM | POA: Insufficient documentation

## 2020-06-17 DIAGNOSIS — Z7901 Long term (current) use of anticoagulants: Secondary | ICD-10-CM | POA: Diagnosis not present

## 2020-06-17 DIAGNOSIS — Z8249 Family history of ischemic heart disease and other diseases of the circulatory system: Secondary | ICD-10-CM | POA: Insufficient documentation

## 2020-06-17 DIAGNOSIS — Z833 Family history of diabetes mellitus: Secondary | ICD-10-CM | POA: Insufficient documentation

## 2020-06-17 DIAGNOSIS — I495 Sick sinus syndrome: Secondary | ICD-10-CM | POA: Diagnosis not present

## 2020-06-17 DIAGNOSIS — Z79899 Other long term (current) drug therapy: Secondary | ICD-10-CM | POA: Insufficient documentation

## 2020-06-17 DIAGNOSIS — I4819 Other persistent atrial fibrillation: Secondary | ICD-10-CM | POA: Insufficient documentation

## 2020-06-17 DIAGNOSIS — D6869 Other thrombophilia: Secondary | ICD-10-CM | POA: Diagnosis not present

## 2020-06-26 ENCOUNTER — Other Ambulatory Visit: Payer: Self-pay | Admitting: Internal Medicine

## 2020-06-27 NOTE — Telephone Encounter (Signed)
Pt's pharmacy is requesting a refill on pantoprazole. Would Dr. Allred like to refill this medication? Please address 

## 2020-07-05 ENCOUNTER — Ambulatory Visit (INDEPENDENT_AMBULATORY_CARE_PROVIDER_SITE_OTHER): Payer: Medicare Other

## 2020-07-05 DIAGNOSIS — R55 Syncope and collapse: Secondary | ICD-10-CM | POA: Diagnosis not present

## 2020-07-05 DIAGNOSIS — I495 Sick sinus syndrome: Secondary | ICD-10-CM

## 2020-07-05 LAB — CUP PACEART REMOTE DEVICE CHECK
Date Time Interrogation Session: 20211102092513
Implantable Lead Implant Date: 20181212
Implantable Lead Implant Date: 20181212
Implantable Lead Location: 753859
Implantable Lead Location: 753860
Implantable Lead Model: 377
Implantable Lead Model: 377
Implantable Lead Serial Number: 80523187
Implantable Lead Serial Number: 80573594
Implantable Pulse Generator Implant Date: 20181212
Pulse Gen Model: 407145
Pulse Gen Serial Number: 69192108

## 2020-07-07 NOTE — Progress Notes (Signed)
Remote pacemaker transmission.   

## 2020-07-26 ENCOUNTER — Ambulatory Visit: Payer: Medicare Other | Admitting: Pulmonary Disease

## 2020-07-26 ENCOUNTER — Encounter: Payer: Self-pay | Admitting: Pulmonary Disease

## 2020-07-26 ENCOUNTER — Other Ambulatory Visit: Payer: Self-pay

## 2020-07-26 VITALS — BP 118/64 | HR 63 | Temp 97.0°F | Ht 72.0 in | Wt 184.0 lb

## 2020-07-26 DIAGNOSIS — R0602 Shortness of breath: Secondary | ICD-10-CM

## 2020-07-26 DIAGNOSIS — U099 Post covid-19 condition, unspecified: Secondary | ICD-10-CM | POA: Diagnosis not present

## 2020-07-26 DIAGNOSIS — Z23 Encounter for immunization: Secondary | ICD-10-CM

## 2020-07-26 NOTE — Patient Instructions (Signed)
We will schedule you for high-resolution CT and pulmonary function test for complete evaluation of your lungs Continue to work on an exercise regimen  Follow-up in 1 to 2 months.

## 2020-07-26 NOTE — Progress Notes (Signed)
Antonio Curtis    [id number]    [birth year]  Primary Care Physician:Gates, Molly Maduro, MD  Referring Physician: Marden Noble, MD [address] [place],  [place] [address]  Chief complaint: Consult for post COVID-52  HPI: 79 year old with atrial fibrillation, sick sinus syndrome Contracted COVID-19 in December 2020 treated with monoclonal antibody.  He did not require hospitalization.  Post Covid he has significant dyspnea on exertion.  Gets short of breath while walking to his mailbox or doing any yard work, climbing stairs.  Denies any fevers, chills, cough He has atrial fibrillation and underwent ablation in September 2021.  This improved his breathing somewhat but he continues to have some persistent issues.  He also has history of OSA and is intolerant of CPAP.  Recently had a titration study and has been prescribed BiPAP which he has not received yet.  He follows with Dr. Mayford Knife, cardiology  Pets: No pets Occupation: Retired Education officer, community Exposures: Exposure to [employer] when he was growing up.  No ongoing exposures.  No mold, hot tub, Jacuzzi.  No feather pillows or comforters Smoking history: Never smoker Travel history: Originally from [place].  Moved to [place] the past year Relevant family history: No significant family history of lung disease  Outpatient Encounter Medications as of 07/26/2020  Medication Sig  . Cholecalciferol (VITAMIN D-3) 125 MCG (5000 UT) TABS Take 5,000 Units by mouth daily.   Marland Kitchen ELIQUIS 5 MG TABS tablet TAKE 1 TABLET BY MOUTH EACH MORNING AND EVENING AS DIRECTED (NEEDS TO SCHEDULE F/U APPT)  . levothyroxine (SYNTHROID) 88 MCG tablet Take 88 mcg by mouth daily.  . metoprolol tartrate 37.5 MG TABS Take 37.5 mg by mouth 2 (two) times daily.  . propafenone (RYTHMOL) 225 MG tablet Take 1 tablet (225 mg total) by mouth every 8 (eight) hours.  . [DISCONTINUED] diphenhydrAMINE (BENADRYL) 25 MG tablet Take 25 mg by mouth at bedtime.  .  [DISCONTINUED] levothyroxine (SYNTHROID) 75 MCG tablet Take by mouth daily before breakfast.   . [DISCONTINUED] metoprolol tartrate (LOPRESSOR) 50 MG tablet Take 37.5 mg by mouth 2 (two) times daily.  . [DISCONTINUED] pantoprazole (PROTONIX) 40 MG tablet Take 1 tablet (40 mg total) by mouth daily.   No facility-administered encounter medications on file as of 07/26/2020.    Allergies as of 07/26/2020  . (No Known Allergies)    Past Medical History:  Diagnosis Date  . H/O partial resection of colon   . Hypothyroid   . 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 Paroxysmal atrial fibrillation (HCC)   . Sick sinus syndrome [facility])     Past Surgical History:  Procedure Laterality Date  . ACHILLES TENDON REPAIR    . APPENDECTOMY    . ATRIAL FIBRILLATION ABLATION N/A 05/20/2020   Procedure: ATRIAL FIBRILLATION ABLATION;  Surgeon: [name], MD;  Location[facility];  Service: Cardiovascular;  Laterality: N/A;  . COLON SURGERY     Hemicolectomy:  Polyp.    Marland Kitchen PACEMAKER IMPLANT     Biotronik PPM implanted in [place] by Dr [name].  . TONSILLECTOMY      Family History  Problem Relation Age of Onset  . Heart disease Father 59  . Diabetes Father     Social History   Socioeconomic History  . Marital status: Married    Spouse name: Not on file  . Number of children: Not on file  . Years of education: Not on file  .  Highest education level: Not on file  Occupational History  . Not on file  Tobacco Use  . Smoking status: Never Smoker  . Smokeless tobacco: Never Used  Vaping Use  . Vaping Use: Never used  Substance and Sexual Activity  . Alcohol use: Yes    Alcohol/week: 1.0 - 2.0 standard drink    Types: 1 - 2 Glasses of wine per week    Comment: monthly  . Drug use: Not Currently  . Sexual activity: Not on file  Other Topics Concern  . Not on file  Social History Narrative   Five children.  7 Grand.  Retired Education officer, community.   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    Social Determinants of Health   Financial Resource Strain:   . Difficulty of Paying Living Expenses: Not on file  Food Insecurity:   . Worried About Programme researcher, broadcasting/film/video in the Last Year: Not on file  . Ran Out of Food in the Last Year: Not on file  Transportation Needs:   . Lack of Transportation (Medical): Not on file  . Lack of Transportation (Non-Medical): Not on file  Physical Activity:   . Days of Exercise per Week: Not on file  . Minutes of Exercise per Session: Not on file  Stress:   . Feeling of Stress : Not on file  Social Connections:   . Frequency of Communication with Friends and Family: Not on file  . Frequency of Social Gatherings with Friends and Family: Not on file  . Attends Religious Services: Not on file  . Active Member of Clubs or Organizations: Not on file  . Attends Banker Meetings: Not on file  . Marital Status: Not on file  Intimate Partner Violence:   . Fear of Current or Ex-Partner: Not on file  . Emotionally Abused: Not on file  . Physically Abused: Not on file  . Sexually Abused: Not on file    Review of systems: Review of Systems  Constitutional: Negative for fever and chills.  HENT: Negative.   Eyes: Negative for blurred vision.  Respiratory: as per HPI  Cardiovascular: Negative for chest pain and palpitations.  Gastrointestinal: Negative for vomiting, diarrhea, blood per rectum. Genitourinary: Negative for dysuria, urgency, frequency and hematuria.  Musculoskeletal: Negative for myalgias, back pain and joint pain.  Skin: Negative for itching and rash.  Neurological: Negative for dizziness, tremors, focal weakness, seizures and loss of consciousness.  Endo/Heme/Allergies: Negative for environmental allergies.  Psychiatric/Behavioral: Negative for depression, suicidal ideas and hallucinations.  All other systems reviewed and are negative.  Physical Exam: Blood pressure 118/64, pulse 63, temperature (!) 97 F (36.1 C),  temperature source Skin, height 6' (1.829 m), weight 184 lb (83.5 kg), SpO2 98 %. Gen:      No acute distress HEENT:  EOMI, sclera anicteric Neck:     No masses; no thyromegaly Lungs:    Clear to auscultation bilaterally; normal respiratory effort CV:         Regular rate and rhythm; no murmurs Abd:      + bowel sounds; soft, non-tender; no palpable masses, no distension Ext:    No edema; adequate peripheral perfusion Skin:      Warm and dry; no rash Neuro: alert and oriented x 3 Psych: normal mood and affect  Data Reviewed: Imaging: CT coronaries 05/16/2020-visualized lungs appear clear with no significant interstitial lung disease I have reviewed the images personally.  PFTs:  Labs:  Sleep: Titration study 05/28/2020 Trial of  BiPAP therapy on 12/8 cm H2O with a Medium size Fisher[employer]Paykel Full Face Mask Simplus mask and heated humidification  Assessment:  Post COVID-19 He has persistent symptoms of dyspnea we will get high-res CT and PFTs for evaluation Suspect deconditioning is the main issue as CT coronaries in September did not show significant ILD at least in the visualized lung fields.  Advised him to start an exercise regimen  Plan/Recommendations: High-res CT, PFTs  Chilton Greathouse MD [facility] 07/26/2020, 12:10 PM  CC: Marden Noble, MD

## 2020-08-03 ENCOUNTER — Inpatient Hospital Stay: Admission: RE | Admit: 2020-08-03 | Payer: Medicare Other | Source: Ambulatory Visit

## 2020-08-10 ENCOUNTER — Ambulatory Visit: Payer: Medicare Other | Admitting: Pulmonary Disease

## 2020-08-12 ENCOUNTER — Ambulatory Visit (INDEPENDENT_AMBULATORY_CARE_PROVIDER_SITE_OTHER)
Admission: RE | Admit: 2020-08-12 | Discharge: 2020-08-12 | Disposition: A | Discharge status: Home / Self Care | Payer: Medicare Other | Source: Ambulatory Visit | Attending: Pulmonary Disease | Admitting: Pulmonary Disease

## 2020-08-12 ENCOUNTER — Other Ambulatory Visit: Payer: Self-pay

## 2020-08-12 DIAGNOSIS — R0602 Shortness of breath: Secondary | ICD-10-CM | POA: Diagnosis not present

## 2020-08-22 ENCOUNTER — Encounter: Payer: Self-pay | Admitting: Internal Medicine

## 2020-08-22 ENCOUNTER — Ambulatory Visit: Payer: Medicare Other | Admitting: Internal Medicine

## 2020-08-22 ENCOUNTER — Other Ambulatory Visit: Payer: Self-pay

## 2020-08-22 VITALS — BP 122/68 | HR 86 | Ht 72.0 in | Wt 186.4 lb

## 2020-08-22 DIAGNOSIS — I495 Sick sinus syndrome: Secondary | ICD-10-CM

## 2020-08-22 DIAGNOSIS — G4733 Obstructive sleep apnea (adult) (pediatric): Secondary | ICD-10-CM | POA: Diagnosis not present

## 2020-08-22 DIAGNOSIS — I4819 Other persistent atrial fibrillation: Secondary | ICD-10-CM | POA: Diagnosis not present

## 2020-08-22 DIAGNOSIS — D6869 Other thrombophilia: Secondary | ICD-10-CM | POA: Diagnosis not present

## 2020-08-22 LAB — CUP PACEART INCLINIC DEVICE CHECK
Battery Remaining Longevity: 79 mo
Brady Statistic RA Percent Paced: 97 %
Brady Statistic RV Percent Paced: 5 %
Date Time Interrogation Session: 20211220095704
Implantable Lead Implant Date: 20181212
Implantable Lead Implant Date: 20181212
Implantable Lead Location: 753859
Implantable Lead Location: 753860
Implantable Lead Model: 377
Implantable Lead Model: 377
Implantable Lead Serial Number: 80523187
Implantable Lead Serial Number: 80573594
Implantable Pulse Generator Implant Date: 20181212
Lead Channel Impedance Value: 585 Ohm
Lead Channel Impedance Value: 741 Ohm
Lead Channel Pacing Threshold Amplitude: 0.9 V
Lead Channel Pacing Threshold Amplitude: 1.3 V
Lead Channel Pacing Threshold Pulse Width: 0.4 ms
Lead Channel Pacing Threshold Pulse Width: 0.4 ms
Lead Channel Sensing Intrinsic Amplitude: 0.7 mV
Lead Channel Sensing Intrinsic Amplitude: 18.9 mV
Lead Channel Sensing Intrinsic Amplitude: 8.4 mV
Lead Channel Setting Pacing Amplitude: 2 V
Lead Channel Setting Pacing Amplitude: 2.4 V
Lead Channel Setting Pacing Pulse Width: 0.4 ms
Pulse Gen Model: 407145
Pulse Gen Serial Number: 69192108

## 2020-08-22 NOTE — Progress Notes (Signed)
   PCP: Marden Noble, MD Primary EP: Antonio Curtis is a 79 y.o. male who presents today for routine electrophysiology followup.  Since his recent afib ablation, the patient reports doing very well.  he denies procedure related complications and is pleased with the results of the procedure.  Today, he denies symptoms of palpitations, chest pain, shortness of breath,  lower extremity edema, dizziness, presyncope, or syncope.  The patient is otherwise without complaint today.   Past Medical History:  Diagnosis Date  . H/O partial resection of colon   . Hypothyroid   . Pacemaker    Biotronik Edora 8 Antonio-T  . Paroxysmal atrial fibrillation (HCC)   . Sick sinus syndrome [facility])    Past Surgical History:  Procedure Laterality Date  . ACHILLES TENDON REPAIR    . APPENDECTOMY    . ATRIAL FIBRILLATION ABLATION N/A 05/20/2020   Procedure: ATRIAL FIBRILLATION ABLATION;  Surgeon: [name], MD;  [facility];  Service: Cardiovascular;  Laterality: N/A;  . COLON SURGERY     Hemicolectomy:  Polyp.    Marland Kitchen PACEMAKER IMPLANT     Biotronik PPM implanted in [place] by Antonio [name].  . TONSILLECTOMY      ROS- all systems are personally reviewed and negatives except as per HPI above  Current Outpatient Medications  Medication Sig Dispense Refill  . Cholecalciferol (VITAMIN D-3) 125 MCG (5000 UT) TABS Take 5,000 Units by mouth daily.     Marland Kitchen ELIQUIS 5 MG TABS tablet TAKE 1 TABLET BY MOUTH EACH MORNING AND EVENING AS DIRECTED (NEEDS TO SCHEDULE F/U APPT) 180 tablet 1  . levothyroxine (SYNTHROID) 88 MCG tablet Take 88 mcg by mouth daily.    . metoprolol tartrate 37.5 MG TABS Take 37.5 mg by mouth 2 (two) times daily. 180 tablet 3  . propafenone (RYTHMOL) 225 MG tablet Take 1 tablet (225 mg total) by mouth every 8 (eight) hours. 270 tablet 3   No current facility-administered medications for this visit.    Physical Exam: Vitals:   08/22/20 0929  BP: 122/68  Pulse: 86  SpO2:  97%  Weight: 186 lb 6.4 oz (84.6 kg)  Height: 6' (1.829 m)    GEN- The patient is well appearing, alert and oriented x 3 today.   Head- normocephalic, atraumatic Eyes-  Sclera clear, conjunctiva pink Ears- hearing intact Oropharynx- clear Lungs- Clear to ausculation bilaterally, normal work of breathing Heart- Regular rate and rhythm, no murmurs, rubs or gallops, PMI not laterally displaced GI- soft, NT, ND, + BS Extremities- no clubbing, cyanosis, or edema  EKG tracing ordered today is personally reviewed and shows sinus with demand V pacing  Assessment and Plan:  1. Persistent atrial fibrillation Doing well s/p ablation chads2vasc score is 2.  Continue on eliquis Stop propafenone  2. OSA Using CPAP  3. Sick sinus Normal pacemaker function See Antonio Curtis Art report No changes today he is not device dependant today   Return to see me in 3 months  [name] MD, [facility] 08/22/2020 10:09 AM

## 2020-08-22 NOTE — Patient Instructions (Addendum)
Medication Instructions:  Stop your Propafenone on 09/07/20  *If you need a refill on your cardiac medications before your next appointment, please call your pharmacy*  Lab Work: None ordered.  If you have labs (blood work) drawn today and your tests are completely normal, you will receive your results only by: Marland Kitchen MyChart Message (if you have MyChart) OR . A paper copy in the mail If you have any lab test that is abnormal or we need to change your treatment, we will call you to review the results.  Testing/Procedures: None ordered.  Follow-Up: At [facility], you and your health needs are our priority.  As part of our continuing mission to provide you with exceptional heart care, we have created designated Provider Care Teams.  These Care Teams include your primary Cardiologist (physician) and Advanced Practice Providers (APPs -  Physician Assistants and Nurse Practitioners) who all work together to provide you with the care you need, when you need it.  We recommend signing up for the patient portal called "MyChart".  Sign up information is provided on this After Visit Summary.  MyChart is used to connect with patients for Virtual Visits (Telemedicine).  Patients are able to view lab/test results, encounter notes, upcoming appointments, etc.  Non-urgent messages can be sent to your provider as well.   To learn more about what you can do with MyChart, go to [email or web address].    Your next appointment:   Your physician wants you to follow-up in: 11/21/20 at 9:45 am with Dr. Johney Frame.  Remote monitoring is used to monitor your Pacemaker from home. This monitoring reduces the number of office visits required to check your device to one time per year. It allows [place] to keep an eye on the functioning of your device to ensure it is working properly. You are scheduled for a device check from home on 10/04/20. You may send your transmission at any time that day. If you have a wireless device, the  transmission will be sent automatically. After your physician reviews your transmission, you will receive a postcard with your next transmission date.  Other Instructions:

## 2020-08-23 NOTE — Addendum Note (Signed)
Addended by: Sampson Goon on: 08/23/2020 09:29 AM   Modules accepted: Orders

## 2020-09-07 ENCOUNTER — Other Ambulatory Visit: Payer: Self-pay | Admitting: Cardiology

## 2020-09-27 ENCOUNTER — Other Ambulatory Visit (HOSPITAL_COMMUNITY)
Admission: RE | Admit: 2020-09-27 | Discharge: 2020-09-27 | Disposition: A | Discharge status: Home / Self Care | Payer: Medicare Other | Source: Ambulatory Visit | Attending: Pulmonary Disease | Admitting: Pulmonary Disease

## 2020-09-27 ENCOUNTER — Ambulatory Visit: Payer: Medicare Other | Admitting: Pulmonary Disease

## 2020-09-27 DIAGNOSIS — Z01812 Encounter for preprocedural laboratory examination: Secondary | ICD-10-CM | POA: Insufficient documentation

## 2020-09-27 DIAGNOSIS — Z20822 Contact with and (suspected) exposure to covid-19: Secondary | ICD-10-CM | POA: Diagnosis not present

## 2020-09-27 LAB — SARS CORONAVIRUS 2 (TAT 6-24 HRS): SARS Coronavirus 2: NEGATIVE

## 2020-09-28 ENCOUNTER — Ambulatory Visit: Payer: Medicare Other | Admitting: Pulmonary Disease

## 2020-09-28 ENCOUNTER — Encounter: Payer: Self-pay | Admitting: Pulmonary Disease

## 2020-09-28 ENCOUNTER — Other Ambulatory Visit: Payer: Self-pay

## 2020-09-28 VITALS — BP 128/68 | HR 65 | Temp 97.9°F | Ht 71.0 in | Wt 181.2 lb

## 2020-09-28 DIAGNOSIS — U099 Post covid-19 condition, unspecified: Secondary | ICD-10-CM

## 2020-09-28 DIAGNOSIS — R0602 Shortness of breath: Secondary | ICD-10-CM

## 2020-09-28 LAB — PULMONARY FUNCTION TEST
DL/VA % pred: 105 %
DL/VA: 4.14 ml/min/mmHg/L
DLCO cor % pred: 91 %
DLCO cor: 23.33 ml/min/mmHg
DLCO unc % pred: 91 %
DLCO unc: 23.33 ml/min/mmHg
FEF 25-75 Post: 2.44 L/sec
FEF 25-75 Pre: 2.31 L/sec
FEF2575-%Change-Post: 5 %
FEF2575-%Pred-Post: 110 %
FEF2575-%Pred-Pre: 104 %
FEV1-%Change-Post: 1 %
FEV1-%Pred-Post: 96 %
FEV1-%Pred-Pre: 95 %
FEV1-Post: 3.01 L
FEV1-Pre: 2.97 L
FEV1FVC-%Change-Post: -1 %
FEV1FVC-%Pred-Pre: 105 %
FEV6-%Change-Post: 2 %
FEV6-%Pred-Post: 98 %
FEV6-%Pred-Pre: 95 %
FEV6-Post: 3.97 L
FEV6-Pre: 3.88 L
FEV6FVC-%Change-Post: 0 %
FEV6FVC-%Pred-Post: 105 %
FEV6FVC-%Pred-Pre: 105 %
FVC-%Change-Post: 3 %
FVC-%Pred-Post: 93 %
FVC-%Pred-Pre: 90 %
FVC-Post: 4.03 L
FVC-Pre: 3.91 L
Post FEV1/FVC ratio: 75 %
Post FEV6/FVC ratio: 99 %
Pre FEV1/FVC ratio: 76 %
Pre FEV6/FVC Ratio: 99 %
RV % pred: 107 %
RV: 2.84 L
TLC % pred: 93 %
TLC: 6.76 L

## 2020-09-28 NOTE — Patient Instructions (Signed)
I have reviewed your lung function test which shows normal lung function Your CT scan does not show any significant active issues.  I am glad that your breathing is improved and you are able to get back to your usual levels of activity You can follow-up in the pulmonary clinic as needed.

## 2020-09-28 NOTE — Progress Notes (Signed)
Antonio Curtis    [id number]    [birth year]  Primary Care Physician:Gates, Molly Maduro, MD  Referring Physician: Marden Noble, MD 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  He did not require hospitalization.  Post Covid he has significant dyspnea on exertion.  Gets short of breath while walking to his mailbox or doing any yard work, climbing stairs.  Denies any fevers, chills, cough He has atrial fibrillation and underwent ablation in September 2021.  This improved his breathing somewhat but he continues to have some persistent issues.  He also has history of OSA and is intolerant of CPAP.  Recently had a titration study and has been prescribed BiPAP which he has not received yet.  He follows with Dr. Mayford Knife, cardiology  Pets: No pets Occupation: Retired Education officer, community Exposures: Exposure to [employer] when he was growing up.  No ongoing exposures.  No mold, hot tub, Jacuzzi.  No feather pillows or comforters Smoking history: Never smoker Travel history: Originally from [place].  Moved to [place] the past year Relevant family history: No significant family history of lung disease  Interval history. Here for review of CT scan and PFTs States that his breathing is getting better.  He is back to working out, shoveling snow and ice without any issues  Outpatient Encounter Medications as of 09/28/2020  Medication Sig  . Cholecalciferol (VITAMIN D-3) 125 MCG (5000 UT) TABS Take 5,000 Units by mouth daily.   Marland Kitchen ELIQUIS 5 MG TABS tablet TAKE 1 TABLET BY MOUTH EACH MORNING AND EVENING AS DIRECTED (NEEDS TO SCHEDULE F/U APPT)  . levothyroxine (SYNTHROID) 88 MCG tablet Take 88 mcg by mouth daily.  . Metoprolol Tartrate 37.5 MG TABS TAKE 1 TABALET BY MOUTH 2 (TWO) TIMES DAILY.   No  facility-administered encounter medications on file as of 09/28/2020.   Physical Exam: Blood pressure 118/64, pulse 63, temperature (!) 97 F (36.1 C), temperature source Skin, height 6' (1.829 m), weight 184 lb (83.5 kg), SpO2 98 %. Gen:      No acute distress HEENT:  EOMI, sclera anicteric Neck:     No masses; no thyromegaly Lungs:    Clear to auscultation bilaterally; normal respiratory effort CV:         Regular rate and rhythm; no murmurs Abd:      + bowel sounds; soft, non-tender; no palpable masses, no distension Ext:    No edema; adequate peripheral perfusion Skin:      Warm and dry; no rash Neuro: alert and oriented x 3 Psych: normal mood and affect  Data Reviewed: Imaging: CT coronaries 05/16/2020- visualized lungs appear clear with no significant interstitial lung disease.  High-resolution CT 08/12/2020-no evidence of post Covid pneumonitis or ILD.  Bandlike scarring in the right lower lobe.  Subcentimeter pulmonary nodule.  Coronary artery disease, cholelithiasis.  I have reviewed the images personally.  PFTs: 09/28/2020 FVC 4.03 [93%], FEV1 3.01 [96%], F/F 75, TLC 6.76 [93%], DLCO 23.33 [91%] Normal test  Labs:  Sleep: Tit andration study 05/28/2020 Trial of BiPAP therapy on 12/8 cm H2O with a Medium size Fisher[employer]Paykel Full Face Mask Simplus mask and heated humidification  Assessment:  Post COVID-19 Overall improving and he feels that he is back to baseline No evidence of ILD.  He has linear scarring at the right lower lobe which  is nonspecific.  PFTs are normal Suspect deconditioning is the main issue.   Advised him to continue exercise regimen and stay active Follow-up as needed.  Plan/Recommendations: Follow-up as needed.  Chilton Greathouse MD [facility] 09/28/2020, 10:05 AM  CC: Marden Noble, MD

## 2020-10-04 ENCOUNTER — Ambulatory Visit (INDEPENDENT_AMBULATORY_CARE_PROVIDER_SITE_OTHER): Payer: Medicare Other

## 2020-10-04 DIAGNOSIS — I495 Sick sinus syndrome: Secondary | ICD-10-CM

## 2020-10-04 LAB — CUP PACEART REMOTE DEVICE CHECK
Date Time Interrogation Session: 20220130080000
Implantable Lead Implant Date: 20181212
Implantable Lead Implant Date: 20181212
Implantable Lead Location: 753859
Implantable Lead Location: 753860
Implantable Lead Model: 377
Implantable Lead Model: 377
Implantable Lead Serial Number: 80523187
Implantable Lead Serial Number: 80573594
Implantable Pulse Generator Implant Date: 20181212
Pulse Gen Model: 407145
Pulse Gen Serial Number: 69192108

## 2020-10-13 NOTE — Progress Notes (Signed)
Remote pacemaker transmission.   

## 2020-10-20 ENCOUNTER — Other Ambulatory Visit: Payer: Self-pay | Admitting: Cardiology

## 2020-10-20 DIAGNOSIS — I480 Paroxysmal atrial fibrillation: Secondary | ICD-10-CM

## 2020-11-10 ENCOUNTER — Telehealth: Payer: Self-pay | Admitting: Emergency Medicine

## 2020-11-10 NOTE — Telephone Encounter (Signed)
Attempted to contact patient. Biotronik alert received on 11/10/2020 in reference to increased AF burden from 75 to 85 percent.

## 2020-11-10 NOTE — Telephone Encounter (Signed)
Contacted patient for assessment due to increased A-fib burden 75 to 85 percent from Biotronik alert received this morning 11/10/20. Patient states that 2 days ago he noticed that he was having some A-Fib due to having the same feeling he did before he had the ablation on 09/21. Patient states that he feels fine today with no signs or symptoms. Patient confirms compliance with Eliquis 5mg  daily. Patient given reminder that he has an upcoming appointment with Dr. on 11/21/20.  Patient's propafenone 225 mg every 8 hours was discontinued on the 08/22/20 office visit. Patient had questions as to if he should start taking propafenone again. Patient was advised that I would follow up with Dr. 08/24/20 and would call him back if any changes.

## 2020-11-15 NOTE — Telephone Encounter (Addendum)
LATE ENTRY: 10/20/20 Fax came from [facility] that the patient was refusing set up and they have voided the order.

## 2020-11-21 ENCOUNTER — Ambulatory Visit: Payer: Medicare Other | Admitting: Internal Medicine

## 2020-11-21 ENCOUNTER — Encounter: Payer: Self-pay | Admitting: Internal Medicine

## 2020-11-21 ENCOUNTER — Other Ambulatory Visit: Payer: Self-pay

## 2020-11-21 VITALS — BP 108/60 | HR 80 | Ht 71.0 in | Wt 183.8 lb

## 2020-11-21 DIAGNOSIS — G4733 Obstructive sleep apnea (adult) (pediatric): Secondary | ICD-10-CM | POA: Diagnosis not present

## 2020-11-21 DIAGNOSIS — I4819 Other persistent atrial fibrillation: Secondary | ICD-10-CM

## 2020-11-21 DIAGNOSIS — D6869 Other thrombophilia: Secondary | ICD-10-CM | POA: Diagnosis not present

## 2020-11-21 DIAGNOSIS — I495 Sick sinus syndrome: Secondary | ICD-10-CM

## 2020-11-21 LAB — CUP PACEART INCLINIC DEVICE CHECK
Date Time Interrogation Session: 20220321100615
Implantable Lead Implant Date: 20181212
Implantable Lead Implant Date: 20181212
Implantable Lead Location: 753859
Implantable Lead Location: 753860
Implantable Lead Model: 377
Implantable Lead Model: 377
Implantable Lead Serial Number: 80523187
Implantable Lead Serial Number: 80573594
Implantable Pulse Generator Implant Date: 20181212
Lead Channel Impedance Value: 565 Ohm
Lead Channel Impedance Value: 682 Ohm
Lead Channel Setting Pacing Amplitude: 2 V
Lead Channel Setting Pacing Amplitude: 2.4 V
Lead Channel Setting Pacing Pulse Width: 0.4 ms
Pulse Gen Model: 407145
Pulse Gen Serial Number: 69192108

## 2020-11-21 NOTE — Patient Instructions (Addendum)
Medication Instructions:  Your physician recommends that you continue on your current medications as directed. Please refer to the Current Medication list given to you today.  Labwork: None ordered.  Testing/Procedures: None ordered.  Follow-Up: Your physician wants you to follow-up in: Afib Clinic in 4 weeks, then 4 month follow up with Dr. Johney Frame. Change follow up for Dr. Antoine Poche in 6 months.    Remote monitoring is used to monitor your Pacemaker from home. This monitoring reduces the number of office visits required to check your device to one time per year. It allows [place] to keep an eye on the functioning of your device to ensure it is working properly. You are scheduled for a device check from home on 01/03/21. You may send your transmission at any time that day. If you have a wireless device, the transmission will be sent automatically. After your physician reviews your transmission, you will receive a postcard with your next transmission date.  Any Other Special Instructions Will Be Listed Below (If Applicable).  If you need a refill on your cardiac medications before your next appointment, please call your pharmacy.

## 2020-11-21 NOTE — Progress Notes (Signed)
PCP: Marden Noble, MD Primary Cardiology"  [place] Primary EP:  Antonio Curtis is a 80 y.o. male who presents today for routine electrophysiology followup.  Since last being seen in our clinic, the patient reports doing very well.  Over the past few weeks, his AF has increased.  He is unaware of triggers/ precipitants.  He is anxious about this.  Today, he denies symptoms of palpitations, chest pain, shortness of breath,  lower extremity edema, dizziness, presyncope, or syncope.  The patient is otherwise without complaint today.   Past Medical History:  Diagnosis Date  . H/O partial resection of colon   . Hypothyroid   . Pacemaker    Biotronik Edora 8 Antonio-T  . Paroxysmal atrial fibrillation (HCC)   . Sick sinus syndrome [facility])    Past Surgical History:  Procedure Laterality Date  . ACHILLES TENDON REPAIR    . APPENDECTOMY    . ATRIAL FIBRILLATION ABLATION N/A 05/20/2020   Procedure: ATRIAL FIBRILLATION ABLATION;  Surgeon: [name], MD;  [facility] INV[facility];  Service: Cardiovascular;  Laterality: N/A;  . COLON SURGERY     Hemicolectomy:  Polyp.    Marland Kitchen PACEMAKER IMPLANT     Biotronik PPM implanted in [place] by Antonio [name].  . TONSILLECTOMY      ROS- all systems are reviewed and negative except as per HPI above  Current Outpatient Medications  Medication Sig Dispense Refill  . Cholecalciferol (VITAMIN D-3) 125 MCG (5000 UT) TABS Take 5,000 Units by mouth daily.     Marland Kitchen ELIQUIS 5 MG TABS tablet TAKE 1 TABLET BY MOUTH EACH MORNING AND EVENING AS DIRECTED (NEEDS TO SCHEDULE F/U APPT) 180 tablet 1  . levothyroxine (SYNTHROID) 88 MCG tablet Take 88 mcg by mouth daily.    . Metoprolol Tartrate 37.5 MG TABS TAKE 1 TABALET BY MOUTH 2 (TWO) TIMES DAILY. 180 tablet 3   No current facility-administered medications for this visit.    Physical Exam: Vitals:   11/21/20 1007  BP: 108/60  Pulse: 80  SpO2: 92%  Weight: 183 lb 12.8 oz (83.4 kg)  Height: 5\' 11"   (1.803 m)    GEN- The patient is well appearing, alert and oriented x 3 today.   Head- normocephalic, atraumatic Eyes-  Sclera clear, conjunctiva pink Ears- hearing intact Oropharynx- clear Lungs- Clear to ausculation bilaterally, normal work of breathing Chest- pacemaker pocket is well healed Heart- Regular rate and rhythm, no murmurs, rubs or gallops, PMI not laterally displaced GI- soft, NT, ND, + BS Extremities- no clubbing, cyanosis, or edema  Pacemaker interrogation- reviewed in detail today,  See PACEART report  ekg tracing ordered today is personally reviewed and shows sinus  Assessment and Plan:  1. Symptomatic sinus bradycardia  Normal pacemaker function See Pace Art report No changes today he is not device dependant today  2. Persistent afib chads2vasc score is 2.  He is on eliquis Burden is 2% off AAD thearpy AF events appear to be mostly within the past 48 hours.  He is unaware of triggers.  For now, I would advise conservative measures.  If his AF burden increases then we could consider repeat ablation.  3. OSA Uses CPAP but does not have a machine. Was supposed to be following with Antonio office but does not have an appointment.   Risks, benefits and potential toxicities for medications prescribed and/or refilled reviewed with patient today.   AF clinic in 4 weeks to reassess AF burden Return  to see me in 4 months unless AF burden continues to increase  [name] MD, [facility] 11/21/2020 10:27 AM

## 2020-11-22 NOTE — Telephone Encounter (Signed)
Quintella Reichert, MD  [name], MD; Reesa Chew, CMA When the DME called the patient to set up CPAP the patient refused.   [name] can you call patient and find out why he refused CPAP and find out if he is willing to proceed with a local DME   Traci    Higinio Roger (hospitalist) Dad.   He has OSA by recent sleep study and was supposed to have CPAP arranged with follow-up with you. Says, he never received his machine and his follow-up with you was cancelled.   Can you look into this for him?   Thanks

## 2020-11-22 NOTE — Telephone Encounter (Signed)
Reached out to patient lmtcb. 

## 2020-11-23 ENCOUNTER — Telehealth: Payer: Self-pay | Admitting: *Deleted

## 2020-11-23 NOTE — Telephone Encounter (Signed)
-----   Message from Quintella Reichert, MD sent at 11/21/2020 10:45 AM EDT ----- When the DME called the patient to set up CPAP the patient refused.   [name] can you call patient and find out why he refused CPAP and find out if he is willing to proceed with a local DME  Traci ----- Message ----- From: [name], MD Sent: 11/21/2020  10:42 AM EDT To: Quintella Reichert, MD  Higinio Roger (hospitalist) Dad.  He has OSA by recent sleep study and was supposed to have CPAP arranged with follow-up with you.  Says, he never received his machine and his follow-up with you was cancelled.  Can you look into this for him?  Thanks!

## 2020-11-23 NOTE — Telephone Encounter (Signed)
Patient says he could never get the proper mask he needed from [facility] so he returned his cpap. He is willing to be set up with [facility] now. Arrangements  are being made.

## 2020-11-24 ENCOUNTER — Ambulatory Visit: Payer: Medicare Other | Admitting: Cardiology

## 2020-12-05 ENCOUNTER — Encounter: Payer: Medicare Other | Admitting: Internal Medicine

## 2020-12-26 ENCOUNTER — Other Ambulatory Visit: Payer: Self-pay

## 2020-12-26 ENCOUNTER — Encounter (HOSPITAL_COMMUNITY): Payer: Self-pay | Admitting: Physician Assistant

## 2020-12-26 ENCOUNTER — Ambulatory Visit (HOSPITAL_COMMUNITY)
Admission: RE | Admit: 2020-12-26 | Discharge: 2020-12-26 | Disposition: A | Discharge status: Home / Self Care | Payer: Medicare Other | Source: Ambulatory Visit | Attending: Physician Assistant | Admitting: Physician Assistant

## 2020-12-26 VITALS — BP 118/68 | HR 68 | Ht 71.0 in | Wt 179.8 lb

## 2020-12-26 DIAGNOSIS — D6869 Other thrombophilia: Secondary | ICD-10-CM

## 2020-12-26 DIAGNOSIS — G4733 Obstructive sleep apnea (adult) (pediatric): Secondary | ICD-10-CM | POA: Diagnosis not present

## 2020-12-26 DIAGNOSIS — I480 Paroxysmal atrial fibrillation: Secondary | ICD-10-CM | POA: Diagnosis not present

## 2020-12-26 DIAGNOSIS — Z7901 Long term (current) use of anticoagulants: Secondary | ICD-10-CM | POA: Insufficient documentation

## 2020-12-26 DIAGNOSIS — E039 Hypothyroidism, unspecified: Secondary | ICD-10-CM | POA: Diagnosis not present

## 2020-12-26 DIAGNOSIS — Z79899 Other long term (current) drug therapy: Secondary | ICD-10-CM | POA: Insufficient documentation

## 2020-12-26 DIAGNOSIS — I495 Sick sinus syndrome: Secondary | ICD-10-CM | POA: Insufficient documentation

## 2020-12-26 NOTE — Progress Notes (Signed)
Primary Care Physician: Marden Noble, MD Primary Cardiologist: Dr Antoine Poche  Primary Electrophysiologist: Dr Graciela Husbands Referring Physician: Dr Johney Frame   Antonio Curtis is a 80 y.o. male with a history of SSS s/p PPM, hypothyroid, OSA, and paroxysmal atrial fibrillation who presents for follow up in the [facility]. He reports initially being diagnosed with atrial fibrillation in 2016 after presenting to the ER with numbness down his L side (face, arm and leg) numbness.  He was noted to have afib at the time.  He was also found to be dehydrated at that time. He was placed on flecainide and eliquis.  He did well initially.  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  He had Biotronik PPM implanted by Dr [facility] in [place].  He moved to [facility] 07/2019 and has been followed by Dr Graciela Husbands since that time.  Unfortunately, he began having increasing frequency and duration of atrial fibrillation.  Flecainide was switched to rhythmol, with some improvement in his afib. Not felt to be a good candidate for sotalol or dofetilide with QT prolongation. He is now s/p afib ablation with Dr Johney Frame on 05/20/20. Patient is on Eliquis for a CHADS2VASC score of 2.   On follow up today, patient reports that he has had 3-4 episodes of afib since seeing Dr Johney Frame. The episodes last about 12 hours per his report with symptoms of fatigue. He denies any bleeding issues on anticoagulation.   Today, he denies symptoms of palpitations, chest pain, orthopnea, PND, lower extremity edema, dizziness, presyncope, syncope, bleeding, or neurologic sequela. The patient is tolerating medications without difficulties and is otherwise without complaint today.    Atrial Fibrillation Risk Factors:  he does have symptoms or diagnosis of sleep apnea. he is compliant with CPAP therapy. he does not have a history of rheumatic fever.   he has a BMI of  Body mass index is 25.08 kg/m.Marland Kitchen Filed Weights   12/26/20 1004  Weight: 81.6 kg    Family History  Problem Relation Age of Onset  . Heart disease Father 76  . Diabetes Father      Atrial Fibrillation Management history:  Previous antiarrhythmic drugs: flecainide, propafenone  Previous cardioversions: none Previous ablations: 05/20/20 CHADS2VASC score: 2 Anticoagulation history: Eliquis   Past Medical History:  Diagnosis Date  . H/O partial resection of colon   . Hypothyroid   . 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 Paroxysmal atrial fibrillation (HCC)   . Sick sinus syndrome [facility])    Past Surgical History:  Procedure Laterality Date  . ACHILLES TENDON REPAIR    . APPENDECTOMY    . ATRIAL FIBRILLATION ABLATION N/A 05/20/2020   Procedure: ATRIAL FIBRILLATION ABLATION;  Surgeon: [name], MD;  [facility];  Service: Cardiovascular;  Laterality: N/A;  . COLON SURGERY     Hemicolectomy:  Polyp.    Marland Kitchen PACEMAKER IMPLANT     Biotronik PPM implanted in [place] by Dr [name].  . TONSILLECTOMY      Current Outpatient Medications  Medication Sig Dispense Refill  . Cholecalciferol (VITAMIN D-3) 125 MCG (5000 UT) TABS Take 5,000 Units by mouth daily.     Marland Kitchen ELIQUIS 5 MG TABS tablet TAKE 1 TABLET BY MOUTH EACH MORNING AND EVENING AS DIRECTED (NEEDS TO SCHEDULE F/U APPT) 180 tablet 1  . levothyroxine (SYNTHROID) 88 MCG tablet Take 88 mcg by mouth daily.    Marland Kitchen  Metoprolol Tartrate 37.5 MG TABS TAKE 1 TABALET BY MOUTH 2 (TWO) TIMES DAILY. 180 tablet 3   No current facility-administered medications for this encounter.    No Known Allergies  Social History   Socioeconomic History  . Marital status: Married    Spouse name: Not on file  . Number of children: Not on file  . Years of education: Not on file  . Highest education level: Not on file  Occupational History  . Not on file  Tobacco Use  . Smoking status: Never Smoker  . Smokeless tobacco: Never  Used  Vaping Use  . Vaping Use: Never used  Substance and Sexual Activity  . Alcohol use: Yes    Alcohol/week: 1.0 - 2.0 standard drink    Types: 1 - 2 Glasses of wine per week    Comment: monthly  . Drug use: Never  . Sexual activity: Not on file  Other Topics Concern  . Not on file  Social History Narrative   Five children.  7 Grand.  Retired Education officer, community.   Lives in [place] with spouse.   Social Determinants of Health   Financial Resource Strain: Not on file  Food Insecurity: Not on file  Transportation Needs: Not on file  Physical Activity: Not on file  Stress: Not on file  Social Connections: Not on file  Intimate Partner Violence: Not on file     ROS- All systems are reviewed and negative except as per the HPI above.  Physical Exam: Vitals:   12/26/20 1004  BP: 118/68  Pulse: 68  Weight: 81.6 kg  Height: 5\' 11"  (1.803 m)    GEN- The patient is a well appearing elderly male, alert and oriented x 3 today.   HEENT-head normocephalic, atraumatic, sclera clear, conjunctiva pink, hearing intact, trachea midline. Lungs- Clear to ausculation bilaterally, normal work of breathing Heart- Regular rate and rhythm, no murmurs, rubs or gallops  GI- soft, NT, ND, + BS Extremities- no clubbing, cyanosis, or edema MS- no significant deformity or atrophy Skin- no rash or lesion Psych- euthymic mood, full affect Neuro- strength and sensation are intact   Wt Readings from Last 3 Encounters:  12/26/20 81.6 kg  11/21/20 83.4 kg  09/28/20 82.2 kg    EKG today demonstrates  A paced rhythm, NST Vent. rate 68 BPM PR interval 228 ms QRS duration 98 ms QT/QTcB 418/444 ms  Echo 04/20/20 demonstrated  1. Left ventricular ejection fraction, by estimation, is 60 to 65%. The  left ventricle has normal function. The left ventricle has no regional  wall motion abnormalities. Left ventricular diastolic parameters are  indeterminate.  2. Right ventricular systolic function is  normal. The right ventricular  size is normal. There is normal pulmonary artery systolic pressure.  3. Left atrial size was mildly dilated.  4. The mitral valve is normal in structure. Trivial mitral valve  regurgitation. No evidence of mitral stenosis.  5. The aortic valve is tricuspid. Aortic valve regurgitation is not  visualized. No aortic stenosis is present.  6. Aortic dilatation noted. Aneurysm of the ascending aorta, measuring 37  mm. There is mild dilatation at the level of the sinuses of Valsalva  measuring 38 mm.  7. The inferior vena cava is normal in size with greater than 50%  respiratory variability, suggesting right atrial pressure of 3 mmHg.   Epic records are reviewed at length today  CHA2DS2-VASc Score = 2  The patient's score is based upon: CHF History: No HTN History: No  Diabetes History: No Stroke History: No Vascular Disease History: No Age Score: 2 Gender Score: 0      ASSESSMENT AND PLAN: 1. Paroxysmal Atrial Fibrillation (ICD10:  I48.0) The patient's CHA2DS2-VASc score is 2, indicating a 2.2% annual risk of stroke.   S/p afib ablation with Dr Johney Frame on 05/20/20 He is in SR today.  Patient reports episodes ~ once per week.  He is scheduled for remote transmission on 01/03/21, will see what his burden is at that point. If his burden has substantially increased, he is agreeable to repeat ablation. Continue Eliquis 5 mg BID Continue Lopressor 37.5 mg BID  2. Secondary Hypercoagulable State (ICD10:  D68.69) The patient is at significant risk for stroke/thromboembolism based upon his CHA2DS2-VASc Score of 2.  Continue Apixaban (Eliquis).   3. Obstructive sleep apnea Working with Dr Norris Cross office to get CPAP.  4. SSS S/p PPM, followed by Dr Graciela Husbands and the device clinic.   Follow up with Dr Johney Frame as scheduled, sooner if repeat ablation indicated.    Jorja Loa PA-C Afib Clinic [facility] [address] [place], [place]  [address] [phone] 12/26/2020 10:27 AM

## 2020-12-29 ENCOUNTER — Ambulatory Visit: Payer: Medicare Other | Admitting: Cardiology

## 2021-01-03 ENCOUNTER — Ambulatory Visit (INDEPENDENT_AMBULATORY_CARE_PROVIDER_SITE_OTHER): Payer: Medicare Other

## 2021-01-03 DIAGNOSIS — I4430 Unspecified atrioventricular block: Secondary | ICD-10-CM

## 2021-01-03 LAB — CUP PACEART REMOTE DEVICE CHECK
Date Time Interrogation Session: 20220503125342
Implantable Lead Implant Date: 20181212
Implantable Lead Implant Date: 20181212
Implantable Lead Location: 753859
Implantable Lead Location: 753860
Implantable Lead Model: 377
Implantable Lead Model: 377
Implantable Lead Serial Number: 80523187
Implantable Lead Serial Number: 80573594
Implantable Pulse Generator Implant Date: 20181212
Pulse Gen Model: 407145
Pulse Gen Serial Number: 69192108

## 2021-01-25 NOTE — Progress Notes (Signed)
Remote pacemaker transmission.   

## 2021-02-20 ENCOUNTER — Ambulatory Visit: Payer: Medicare Other | Admitting: Internal Medicine

## 2021-02-20 ENCOUNTER — Other Ambulatory Visit: Payer: Self-pay

## 2021-02-20 VITALS — BP 132/66 | HR 76 | Ht 72.0 in | Wt 184.2 lb

## 2021-02-20 DIAGNOSIS — I4819 Other persistent atrial fibrillation: Secondary | ICD-10-CM

## 2021-02-20 DIAGNOSIS — R001 Bradycardia, unspecified: Secondary | ICD-10-CM | POA: Diagnosis not present

## 2021-02-20 DIAGNOSIS — G4733 Obstructive sleep apnea (adult) (pediatric): Secondary | ICD-10-CM

## 2021-02-20 DIAGNOSIS — I495 Sick sinus syndrome: Secondary | ICD-10-CM | POA: Diagnosis not present

## 2021-02-20 NOTE — Patient Instructions (Addendum)
Medication Instructions:  Your physician recommends that you continue on your current medications as directed. Please refer to the Current Medication list given to you today.  Labwork: None ordered.  Testing/Procedures: None ordered.  Follow-Up: Your physician wants you to follow-up in: 08/21/21 at 4 pm with Dr. Johney Frame.  Remote monitoring is used to monitor your Pacemaker from home. This monitoring reduces the number of office visits required to check your device to one time per year. It allows [place] to keep an eye on the functioning of your device to ensure it is working properly. You are scheduled for a device check from home on 04/04/21. You may send your transmission at any time that day. If you have a wireless device, the transmission will be sent automatically. After your physician reviews your transmission, you will receive a postcard with your next transmission date.  Any Other Special Instructions Will Be Listed Below (If Applicable).  If you need a refill on your cardiac medications before your next appointment, please call your pharmacy.

## 2021-02-20 NOTE — Progress Notes (Signed)
PCP: Marden Noble, MD Primary Cardiologist: Dr Antoine Poche Primary EP:  Dr Johney Frame  Antonio Curtis is a 80 y.o. male who presents today for routine electrophysiology followup.  Since last being seen in our clinic, the patient reports doing reasonably well.  He has had further AFib.  Overall burden by PPM is only 3%.  He has palpitations by his calendar that do not correspond to an AF event on his device.  He likely has symptomatic PACs and PVCs also.  Today, he denies symptoms of chest pain, shortness of breath,  lower extremity edema, dizziness, presyncope, or syncope.  The patient is otherwise without complaint today.   Past Medical History:  Diagnosis Date   H/O partial resection of colon    Hypothyroid    Pacemaker    Biotronik Edora 8 DR-T   Paroxysmal atrial fibrillation (HCC)    Sick sinus syndrome (HCC)    Past Surgical History:  Procedure Laterality Date   ACHILLES TENDON REPAIR     APPENDECTOMY     ATRIAL FIBRILLATION ABLATION N/A 05/20/2020   Procedure: ATRIAL FIBRILLATION ABLATION;  Surgeon: [name], MD;  [facility];  Service: Cardiovascular;  Laterality: N/A;   COLON SURGERY     Hemicolectomy:  Polyp.     PACEMAKER IMPLANT     Biotronik PPM implanted in [place] by Dr [name].   TONSILLECTOMY      ROS- all systems are reviewed and negative except as per HPI above  Current Outpatient Medications  Medication Sig Dispense Refill   Cholecalciferol (VITAMIN D-3) 125 MCG (5000 UT) TABS Take 5,000 Units by mouth daily.      ELIQUIS 5 MG TABS tablet TAKE 1 TABLET BY MOUTH EACH MORNING AND EVENING AS DIRECTED (NEEDS TO SCHEDULE F/U APPT) 180 tablet 1   levothyroxine (SYNTHROID) 88 MCG tablet Take 88 mcg by mouth daily.     Metoprolol Tartrate 37.5 MG TABS TAKE 1 TABALET BY MOUTH 2 (TWO) TIMES DAILY. 180 tablet 3   No current facility-administered medications for this visit.    Physical Exam: Vitals:   02/20/21 1555  BP: 132/66  Pulse: 76   SpO2: 95%  Weight: 184 lb 3.2 oz (83.6 kg)  Height: 6' (1.829 m)    GEN- The patient is well appearing, alert and oriented x 3 today.   Head- normocephalic, atraumatic Eyes-  Sclera clear, conjunctiva pink Ears- hearing intact Oropharynx- clear Lungs- Clear to ausculation bilaterally, normal work of breathing Chest- pacemaker pocket is well healed Heart- Regular rate and rhythm, no murmurs, rubs or gallops, PMI not laterally displaced GI- soft, NT, ND, + BS Extremities- no clubbing, cyanosis, or edema  Pacemaker interrogation- reviewed in detail today,  See PACEART report  ekg tracing ordered today is personally reviewed and shows sinus  Assessment and Plan:  1. Symptomatic sinus bradycardia  Normal pacemaker function See [name] Art report He was symptomatic during threshold testing today.  We have therefore turned off auto threshold testing to see if his palpitations are resolved. he is not device dependant today  2. Persistent afib AF burden is 3 % (previously 2%) post ablation off AAD therapy He has palpitations by his calendar that do not correspond to an AF event on his device.  He likely has symptomatic PACs and PVCs also. We discussed options of AAD drug vs repeat ablation.  For now, he does not wish to make changes.  We will therefore follow conservatively.  3. OSA Uses CPAP but does  not have one currently.  Return 6 months  [name] MD, [facility] 02/20/2021 4:02 PM

## 2021-03-29 ENCOUNTER — Encounter: Payer: Medicare Other | Admitting: Internal Medicine

## 2021-04-04 ENCOUNTER — Ambulatory Visit (INDEPENDENT_AMBULATORY_CARE_PROVIDER_SITE_OTHER): Payer: Medicare Other

## 2021-04-04 DIAGNOSIS — I495 Sick sinus syndrome: Secondary | ICD-10-CM | POA: Diagnosis not present

## 2021-04-04 LAB — CUP PACEART REMOTE DEVICE CHECK
Date Time Interrogation Session: 20220802091431
Implantable Lead Implant Date: 20181212
Implantable Lead Implant Date: 20181212
Implantable Lead Location: 753859
Implantable Lead Location: 753860
Implantable Lead Model: 377
Implantable Lead Model: 377
Implantable Lead Serial Number: 80523187
Implantable Lead Serial Number: 80573594
Implantable Pulse Generator Implant Date: 20181212
Pulse Gen Model: 407145
Pulse Gen Serial Number: 69192108

## 2021-04-15 ENCOUNTER — Other Ambulatory Visit: Payer: Self-pay | Admitting: Cardiology

## 2021-04-15 DIAGNOSIS — I480 Paroxysmal atrial fibrillation: Secondary | ICD-10-CM

## 2021-04-17 NOTE — Telephone Encounter (Signed)
Prescription refill request for Eliquis received. Indication:afib Last office visit:allred 02/20/21 Scr:1.4 04/28/20 Age: 42m Weight:83.6kg

## 2021-04-29 NOTE — Progress Notes (Signed)
Remote pacemaker transmission.   

## 2021-05-02 NOTE — Progress Notes (Signed)
Cardiology Office Note   Date:  05/03/2021   ID:  Antonio Curtis, DOB Jan 25, 1941, MRN [id number]  PCP:  Antonio Noble, MD Cardiologist:   Rollene Rotunda, MD Referring:  Antonio Noble, MD  Chief Complaint  Patient presents with   Atrial Fibrillation       History of Present Illness: Antonio Curtis is a 80 y.o. male who is referred by Antonio Curtis, MDfor  evaluation of atrial fib.   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   He had atrial fib ablation last year.   He has had few episodes of A. fib since then.    He has had some increasing palpitations.  When he saw Dr. Johney Frame he was having PACs and PVCs but he was still having 2 to 3% A. fib.  The last interrogation it was up to 13%.  He says he feels the short of breath and lightheaded when it is happening.  He is actually in fib/flutter today.  His ventricular rate is 99.  He is not having any chest pressure, neck or arm discomfort.  He is not having any PND or orthopnea.  He says they last anywhere from an hour to 10 hours.   Past Medical History:  Diagnosis Date   H/O partial resection of colon    Hypothyroid    Pacemaker    Biotronik Edora 8 DR-T   Paroxysmal atrial fibrillation (HCC)    Sick sinus syndrome (HCC)     Past Surgical History:  Procedure Laterality Date   ACHILLES TENDON REPAIR     APPENDECTOMY     ATRIAL FIBRILLATION ABLATION N/A 05/20/2020   Procedure: ATRIAL FIBRILLATION ABLATION;  Surgeon: [name], MD;  [facility]: [facility];  Service: Cardiovascular;  Laterality: N/A;   COLON SURGERY     Hemicolectomy:  Polyp.     PACEMAKER IMPLANT     Biotronik PPM implanted in [place] by Dr [name].   TONSILLECTOMY       Current Outpatient Medications  Medication Sig Dispense Refill   ELIQUIS 5 MG TABS tablet TAKE 1 TABLET BY MOUTH EACH MORNING AND EVENING AS DIRECTED (NEEDS TO SCHEDULE F/U APPT) 180 tablet 1   levothyroxine  (SYNTHROID) 88 MCG tablet Take 88 mcg by mouth daily.     Metoprolol Tartrate 37.5 MG TABS TAKE 1 TABALET BY MOUTH 2 (TWO) TIMES DAILY. 180 tablet 3   No current facility-administered medications for this visit.    Allergies:   Patient has no known allergies.    ROS:  Please see the history of present illness.   Otherwise, review of systems are positive for none.   All other systems are reviewed and negative.    PHYSICAL EXAM: VS:  BP 126/60   Pulse 99   Resp 20   Ht 6' (1.829 m)   Wt 183 lb (83 kg)   SpO2 98%   BMI 24.82 kg/m  , BMI Body mass index is 24.82 kg/m. GENERAL:  Well appearing NECK:  No jugular venous distention, waveform within normal limits, carotid upstroke brisk and symmetric, no bruits, no thyromegaly LUNGS:  Clear to auscultation bilaterally CHEST: Well-healed pacemaker pocket HEART:  PMI not displaced or sustained,S1 and S2 within normal limits, no S3,  no clicks, no rubs, no murmurs, irregular ABD:  Flat, positive bowel sounds normal in frequency in pitch, no bruits, no rebound, no guarding, no midline pulsatile  mass, no hepatomegaly, no splenomegaly EXT:  2 plus pulses throughout, no edema, no cyanosis no clubbing   EKG:  EKG is  ordered today. The ekg ordered today demonstrates atrial fibrillation.  Demand ventricular pacing.  Ventricular rate 99.   Recent Labs: No results found for requested labs within last 8760 hours.    Lipid Panel No results found for: CHOL, TRIG, HDL, CHOLHDL, VLDL, LDLCALC, LDLDIRECT    Wt Readings from Last 3 Encounters:  05/03/21 183 lb (83 kg)  02/20/21 184 lb 3.2 oz (83.6 kg)  12/26/20 179 lb 12.8 oz (81.6 kg)      Other studies Reviewed: Additional studies/ records that were reviewed today include: None Review of the above records demonstrates:  Please see elsewhere in the note.     ASSESSMENT AND PLAN:  ATRIAL FIB:  He tolerates anticoagulation.  Mr. Gabrielle Mester has a CHA2DS2 - VASc score of 2.  I have  sent a message to Dr. Johney Frame to consider second ablation.  The patient is not interested in antiarrhythmic therapy.  He tolerates anticoagulation.  SICK SINUS SYNDROME/PACEMAKER :    He had normal pacemaker function checked earlier this year.   POOR SLEEP:    He does have sleep apnea.  He has had trouble with the mask and with responsiveness of the company and I have sent a message to Dr. Mayford Knife.  I really do encourage him to pursue management of this as a will help with rhythm control.   Current medicines are reviewed at length with the patient today.  The patient does not have concerns regarding medicines.  The following changes have been made:  None  Labs/ tests ordered today include: None  Orders Placed This Encounter  Procedures   EKG 12-Lead      Disposition:   FU with me in 12 months.      Signed, Rollene Rotunda, MD  05/03/2021 10:09 AM    [facility]

## 2021-05-03 ENCOUNTER — Encounter: Payer: Self-pay | Admitting: Cardiology

## 2021-05-03 ENCOUNTER — Other Ambulatory Visit: Payer: Self-pay

## 2021-05-03 ENCOUNTER — Ambulatory Visit: Payer: Medicare Other | Admitting: Cardiology

## 2021-05-03 VITALS — BP 126/60 | HR 99 | Resp 20 | Ht 72.0 in | Wt 183.0 lb

## 2021-05-03 DIAGNOSIS — I495 Sick sinus syndrome: Secondary | ICD-10-CM

## 2021-05-03 DIAGNOSIS — I480 Paroxysmal atrial fibrillation: Secondary | ICD-10-CM

## 2021-05-03 NOTE — Patient Instructions (Addendum)
Medication Instructions:   No changes  *If you need a refill on your cardiac medications before your next appointment, please call your pharmacy*   Lab Work: Not needed   Testing/Procedures: Not needed   Follow-Up: At [facility], you and your health needs are our priority.  As part of our continuing mission to provide you with exceptional heart care, we have created designated Provider Care Teams.  These Care Teams include your primary Cardiologist (physician) and Advanced Practice Providers (APPs -  Physician Assistants and Nurse Practitioners) who all work together to provide you with the care you need, when you need it.  We recommend signing up for the patient portal called "MyChart".  Sign up information is provided on this After Visit Summary.  MyChart is used to connect with patients for Virtual Visits (Telemedicine).  Patients are able to view lab/test results, encounter notes, upcoming appointments, etc.  Non-urgent messages can be sent to your provider as well.   To learn more about what you can do with MyChart, go to [email or web address].    Your next appointment:   12 month(s)  The format for your next appointment:   In Person  Provider:   Rollene Rotunda, MD   Other Instructions  Contact sleep coordinator Nina  [phone]

## 2021-05-11 ENCOUNTER — Telehealth: Payer: Self-pay | Admitting: Pulmonary Disease

## 2021-05-11 NOTE — Telephone Encounter (Signed)
Patient scheduled with Dr. Isaiah Serge 05/12/21 at 3pm. Nothing further at this time.

## 2021-05-12 ENCOUNTER — Ambulatory Visit: Payer: Medicare Other | Admitting: Pulmonary Disease

## 2021-05-12 NOTE — Telephone Encounter (Signed)
Reached out to patient to get him set up with [facility] and he states he is going to see his pulmonologist dr Isaiah Serge for his sleep therapy and if need be he will call us back.Antonio Curtis

## 2021-05-18 ENCOUNTER — Other Ambulatory Visit: Payer: Self-pay

## 2021-05-18 ENCOUNTER — Ambulatory Visit: Payer: Medicare Other | Admitting: Cardiology

## 2021-05-18 ENCOUNTER — Ambulatory Visit (INDEPENDENT_AMBULATORY_CARE_PROVIDER_SITE_OTHER): Payer: Medicare Other | Admitting: Internal Medicine

## 2021-05-18 VITALS — BP 124/68 | HR 74 | Ht 72.0 in | Wt 179.2 lb

## 2021-05-18 DIAGNOSIS — I480 Paroxysmal atrial fibrillation: Secondary | ICD-10-CM | POA: Diagnosis not present

## 2021-05-18 DIAGNOSIS — D6869 Other thrombophilia: Secondary | ICD-10-CM | POA: Diagnosis not present

## 2021-05-18 DIAGNOSIS — I495 Sick sinus syndrome: Secondary | ICD-10-CM

## 2021-05-18 DIAGNOSIS — I4819 Other persistent atrial fibrillation: Secondary | ICD-10-CM

## 2021-05-18 NOTE — Progress Notes (Signed)
PCP: Marden Noble, MD Primary Cardiologist: Dr Antoine Poche Primary EP:  Dr Johney Frame  Antonio Curtis is a 80 y.o. male who presents today for routine electrophysiology followup.  Since last being seen in our clinic, the patient reports doing reaonably well.  His afib episodes have increased.  + palpitations and fatigue. Today, he denies symptoms of palpitations, chest pain, shortness of breath,  lower extremity edema, dizziness, presyncope, or syncope.  The patient is otherwise without complaint today.   Past Medical History:  Diagnosis Date   H/O partial resection of colon    Hypothyroid    Pacemaker    Biotronik Edora 8 DR-T   Paroxysmal atrial fibrillation (HCC)    Sick sinus syndrome (HCC)    Past Surgical History:  Procedure Laterality Date   ACHILLES TENDON REPAIR     APPENDECTOMY     ATRIAL FIBRILLATION ABLATION N/A 05/20/2020   Procedure: ATRIAL FIBRILLATION ABLATION;  Surgeon: [name], MD;  Location: [facility];  Service: Cardiovascular;  Laterality: N/A;   COLON SURGERY     Hemicolectomy:  Polyp.     PACEMAKER IMPLANT     Biotronik PPM implanted in [place] by Dr [name].   TONSILLECTOMY      ROS- all systems are reviewed and negative except as per HPI above  Current Outpatient Medications  Medication Sig Dispense Refill   ELIQUIS 5 MG TABS tablet TAKE 1 TABLET BY MOUTH EACH MORNING AND EVENING AS DIRECTED (NEEDS TO SCHEDULE F/U APPT) 180 tablet 1   levothyroxine (SYNTHROID) 88 MCG tablet Take 88 mcg by mouth daily.     Metoprolol Tartrate 37.5 MG TABS TAKE 1 TABALET BY MOUTH 2 (TWO) TIMES DAILY. 180 tablet 3   No current facility-administered medications for this visit.    Physical Exam: Vitals:   05/18/21 1531  BP: 124/68  Pulse: 74  SpO2: 97%  Weight: 179 lb 3.2 oz (81.3 kg)  Height: 6' (1.829 m)    GEN- The patient is well appearing, alert and oriented x 3 today.   Head- normocephalic, atraumatic Eyes-  Sclera clear, conjunctiva  pink Ears- hearing intact Oropharynx- clear Lungs- Clear to ausculation bilaterally, normal work of breathing Chest- pacemaker pocket is well healed Heart- Regular rate and rhythm, no murmurs, rubs or gallops, PMI not laterally displaced GI- soft, NT, ND, + BS Extremities- no clubbing, cyanosis, or edema  Pacemaker interrogation- reviewed in detail today,  See PACEART report  ekg tracing ordered today is personally reviewed and shows atrial paced rhythm,    Assessment and Plan:  1. Symptomatic sinus bradycardia   Normal pacemaker function See Pace Art report No changes today he is not device dependant today  2. Persistent afib AF burden has increased from 3% last visit to 15% The patient has symptomatic, recurrent  atrial fibrillation post ablation 2021. he has failed medical therapy with flecainide. Chads2vasc score is at least 2.  he is anticoagulated with eliquis . Therapeutic strategies for afib including medicine (multaq, tikosyn, amiodarone) and ablation were discussed in detail with the patient today. Risk, benefits, and alternatives to EP study and radiofrequency ablation for afib were also discussed in detail today. These risks include but are not limited to stroke, bleeding, vascular damage, tamponade, perforation, damage to the esophagus, lungs, and other structures, pulmonary vein stenosis, worsening renal function, and death. The patient understands these risk and wishes to proceed.  We will therefore proceed with catheter ablation at the next available time.  Carto, ICE,  anesthesia are requested for the procedure.  Will also obtain cardiac CT prior to the procedure to exclude LAA thrombus and further evaluate atrial anatomy.    [name] MD, [facility] 05/18/2021 3:54 PM

## 2021-05-18 NOTE — Patient Instructions (Signed)
Medication Instructions:  Your physician recommends that you continue on your current medications as directed. Please refer to the Current Medication list given to you today.  Labwork: None ordered.  Testing/Procedures: Your physician has recommended that you have an ablation. Catheter ablation is a medical procedure used to treat some cardiac arrhythmias (irregular heartbeats). During catheter ablation, a long, thin, flexible tube is put into a blood vessel in your groin (upper thigh), or neck. This tube is called an ablation catheter. It is then guided to your heart through the blood vessel. Radio frequency waves destroy small areas of heart tissue where abnormal heartbeats may cause an arrhythmia to start. Please see the instruction sheet given to you today.   Follow-Up: Your physician wants you to follow-up in: Oct 4 anytime from 8 am to 4 pm for lab work at [address] office.  You do not need to be fasting. You will meet with Inetta Fermo for you instruction for the ablation after your labs.   Remote monitoring is used to monitor your Pacemaker from home. This monitoring reduces the number of office visits required to check your device to one time per year. It allows [place] to keep an eye on the functioning of your device to ensure it is working properly. You are scheduled for a device check from home on 07/04/21. You may send your transmission at any time that day. If you have a wireless device, the transmission will be sent automatically. After your physician reviews your transmission, you will receive a postcard with your next transmission date.  Any Other Special Instructions Will Be Listed Below (If Applicable).  If you need a refill on your cardiac medications before your next appointment, please call your pharmacy.   Cardiac Ablation Cardiac ablation is a procedure to destroy (ablate) some heart tissue that is sending bad signals. These bad signals cause problems in heart rhythm. The heart has  many areas that make these signals. If there are problems in these areas, they can make the heart beat in a way that is not normal. Destroying some tissues can help make the heart rhythm normal. Tell your doctor about: Any allergies you have. All medicines you are taking. These include vitamins, herbs, eye drops, creams, and over-the-counter medicines. Any problems you or family members have had with medicines that make you fall asleep (anesthetics). Any blood disorders you have. Any surgeries you have had. Any medical conditions you have, such as kidney failure. Whether you are pregnant or may be pregnant. What are the risks? This is a safe procedure. But problems may occur, including: Infection. Bruising and bleeding. Bleeding into the chest. Stroke or blood clots. Damage to nearby areas of your body. Allergies to medicines or dyes. The need for a pacemaker if the normal system is damaged. Failure of the procedure to treat the problem. What happens before the procedure? Medicines Ask your doctor about: Changing or stopping your normal medicines. This is important. Taking aspirin and ibuprofen. Do not take these medicines unless your doctor tells you to take them. Taking other medicines, vitamins, herbs, and supplements. General instructions Follow instructions from your doctor about what you cannot eat or drink. Plan to have someone take you home from the hospital or clinic. If you will be going home right after the procedure, plan to have someone with you for 24 hours. Ask your doctor what steps will be taken to prevent infection. What happens during the procedure?  An IV tube will be put into  one of your veins. You will be given a medicine to help you relax. The skin on your neck or groin will be numbed. A cut (incision) will be made in your neck or groin. A needle will be put through your cut and into a large vein. A tube (catheter) will be put into the needle. The tube will  be moved to your heart. Dye may be put through the tube. This helps your doctor see your heart. Small devices (electrodes) on the tube will send out signals. A type of energy will be used to destroy some heart tissue. The tube will be taken out. Pressure will be held on your cut. This helps stop bleeding. A bandage will be put over your cut. The exact procedure may vary among doctors and hospitals. What happens after the procedure? You will be watched until you leave the hospital or clinic. This includes checking your heart rate, breathing rate, oxygen, and blood pressure. Your cut will be watched for bleeding. You will need to lie still for a few hours. Do not drive for 24 hours or as long as your doctor tells you. Summary Cardiac ablation is a procedure to destroy some heart tissue. This is done to treat heart rhythm problems. Tell your doctor about any medical conditions you may have. Tell him or her about all medicines you are taking to treat them. This is a safe procedure. But problems may occur. These include infection, bruising, bleeding, and damage to nearby areas of your body. Follow what your doctor tells you about food and drink. You may also be told to change or stop some of your medicines. After the procedure, do not drive for 24 hours or as long as your doctor tells you. This information is not intended to replace advice given to you by your health care provider. Make sure you discuss any questions you have with your health care provider. Document Revised: 07/23/2019 Document Reviewed: 07/23/2019 Elsevier Patient Education  2022 [employer].

## 2021-05-22 NOTE — Addendum Note (Signed)
Addended by: Sampson Goon on: 05/22/2021 12:21 PM   Modules accepted: Orders

## 2021-06-01 ENCOUNTER — Encounter: Payer: Self-pay | Admitting: *Deleted

## 2021-06-06 ENCOUNTER — Other Ambulatory Visit: Payer: Self-pay

## 2021-06-06 ENCOUNTER — Other Ambulatory Visit: Payer: Medicare Other | Admitting: *Deleted

## 2021-06-06 DIAGNOSIS — I495 Sick sinus syndrome: Secondary | ICD-10-CM

## 2021-06-06 DIAGNOSIS — D6869 Other thrombophilia: Secondary | ICD-10-CM

## 2021-06-06 DIAGNOSIS — I4819 Other persistent atrial fibrillation: Secondary | ICD-10-CM

## 2021-06-06 DIAGNOSIS — I480 Paroxysmal atrial fibrillation: Secondary | ICD-10-CM

## 2021-06-06 LAB — CBC WITH DIFFERENTIAL/PLATELET
Basophils Absolute: 0 10*3/uL (ref 0.0–0.2)
Basos: 0 %
EOS (ABSOLUTE): 0.1 10*3/uL (ref 0.0–0.4)
Eos: 1 %
Hematocrit: 42.8 % (ref 37.5–51.0)
Hemoglobin: 14.7 g/dL (ref 13.0–17.7)
Lymphocytes Absolute: 2.1 10*3/uL (ref 0.7–3.1)
Lymphs: 24 %
MCH: 30.7 pg (ref 26.6–33.0)
MCHC: 34.3 g/dL (ref 31.5–35.7)
MCV: 89 fL (ref 79–97)
Monocytes Absolute: 1.1 10*3/uL — ABNORMAL HIGH (ref 0.1–0.9)
Monocytes: 13 %
Neutrophils Absolute: 5.2 10*3/uL (ref 1.4–7.0)
Neutrophils: 62 %
Platelets: 151 10*3/uL (ref 150–450)
RBC: 4.79 x10E6/uL (ref 4.14–5.80)
RDW: 13.6 % (ref 11.6–15.4)
WBC: 8.6 10*3/uL (ref 3.4–10.8)

## 2021-06-06 LAB — BASIC METABOLIC PANEL
BUN/Creatinine Ratio: 15 (ref 10–24)
BUN: 18 mg/dL (ref 8–27)
CO2: 25 mmol/L (ref 20–29)
Calcium: 9.3 mg/dL (ref 8.6–10.2)
Chloride: 102 mmol/L (ref 96–106)
Creatinine, Ser: 1.22 mg/dL (ref 0.76–1.27)
Glucose: 108 mg/dL — ABNORMAL HIGH (ref 70–99)
Potassium: 4.3 mmol/L (ref 3.5–5.2)
Sodium: 137 mmol/L (ref 134–144)
eGFR: 61 mL/min/{1.73_m2} (ref >59)

## 2021-06-20 ENCOUNTER — Telehealth (HOSPITAL_COMMUNITY): Payer: Self-pay | Admitting: *Deleted

## 2021-06-20 NOTE — Telephone Encounter (Signed)
Reaching out to patient to offer assistance regarding upcoming cardiac imaging study; pt verbalizes understanding of appt date/time, parking situation and where to check in, pre-test NPO status and medications ordered, and verified current allergies; name and call back number provided for further questions should they arise  Larey Brick RN Navigator Cardiac Imaging [facility] [phone] office [phone] cell  Patient r/s to October 21 due to Ablation being schedule for 10/27. Instructions reviewed with patient for Oct 21 appt. He will take his morning metoprolol dose 2 hours prior to cardiac CT scan.

## 2021-06-21 ENCOUNTER — Ambulatory Visit (HOSPITAL_COMMUNITY): Admission: RE | Admit: 2021-06-21 | Payer: Medicare Other | Source: Ambulatory Visit

## 2021-06-23 ENCOUNTER — Encounter (HOSPITAL_COMMUNITY): Payer: Self-pay

## 2021-06-23 ENCOUNTER — Other Ambulatory Visit: Payer: Self-pay

## 2021-06-23 ENCOUNTER — Ambulatory Visit (HOSPITAL_COMMUNITY)
Admission: RE | Admit: 2021-06-23 | Discharge: 2021-06-23 | Disposition: A | Discharge status: Home / Self Care | Payer: Medicare Other | Source: Ambulatory Visit | Attending: Internal Medicine | Admitting: Internal Medicine

## 2021-06-23 DIAGNOSIS — I4819 Other persistent atrial fibrillation: Secondary | ICD-10-CM

## 2021-06-23 MED ORDER — DILTIAZEM HCL 25 MG/5ML IV SOLN
INTRAVENOUS | Status: AC
  Filled 2021-06-23: qty 5

## 2021-06-23 MED ORDER — METOPROLOL TARTRATE 5 MG/5ML IV SOLN: 10.0000 mg | Freq: Once | INTRAVENOUS | Status: AC

## 2021-06-23 MED ORDER — DILTIAZEM HCL 25 MG/5ML IV SOLN
10.0000 mg | Freq: Once | INTRAVENOUS | Status: AC
  Administered 2021-06-23: 10 mg via INTRAVENOUS

## 2021-06-23 MED ORDER — METOPROLOL TARTRATE 5 MG/5ML IV SOLN
INTRAVENOUS | Status: AC
  Administered 2021-06-23: 10 mg via INTRAVENOUS
  Filled 2021-06-23: qty 10

## 2021-06-23 MED ORDER — IOHEXOL 350 MG/ML SOLN
100.0000 mL | Freq: Once | INTRAVENOUS | Status: AC | PRN
  Administered 2021-06-23: 100 mL via INTRAVENOUS

## 2021-06-28 NOTE — Pre-Procedure Instructions (Signed)
Instructed patient on the following items: Arrival time 1030 Nothing to eat or drink after midnight No meds AM of procedure Responsible person to drive you home and stay with you for 24 hrs  Have you missed any doses of anti-coagulant Eliquis- hasn't missed any doses    

## 2021-06-29 ENCOUNTER — Other Ambulatory Visit: Payer: Self-pay

## 2021-06-29 ENCOUNTER — Encounter (HOSPITAL_COMMUNITY): Payer: Self-pay | Admitting: Internal Medicine

## 2021-06-29 ENCOUNTER — Ambulatory Visit (HOSPITAL_COMMUNITY): Payer: Medicare Other | Admitting: Anesthesiology

## 2021-06-29 ENCOUNTER — Ambulatory Visit (HOSPITAL_COMMUNITY)
Admission: RE | Admit: 2021-06-29 | Discharge: 2021-06-29 | Disposition: A | Discharge status: Home / Self Care | Payer: Medicare Other | Attending: Internal Medicine | Admitting: Internal Medicine

## 2021-06-29 ENCOUNTER — Encounter (HOSPITAL_COMMUNITY)
Admission: RE | Disposition: A | Discharge status: Home / Self Care | Payer: Medicare Other | Source: Home / Self Care | Attending: Internal Medicine

## 2021-06-29 DIAGNOSIS — Z79890 Hormone replacement therapy (postmenopausal): Secondary | ICD-10-CM | POA: Insufficient documentation

## 2021-06-29 DIAGNOSIS — Z79899 Other long term (current) drug therapy: Secondary | ICD-10-CM | POA: Insufficient documentation

## 2021-06-29 DIAGNOSIS — Z7901 Long term (current) use of anticoagulants: Secondary | ICD-10-CM | POA: Insufficient documentation

## 2021-06-29 DIAGNOSIS — Z950 Presence of cardiac pacemaker: Secondary | ICD-10-CM | POA: Diagnosis not present

## 2021-06-29 DIAGNOSIS — I4819 Other persistent atrial fibrillation: Secondary | ICD-10-CM

## 2021-06-29 HISTORY — PX: ATRIAL FIBRILLATION ABLATION: EP1191

## 2021-06-29 LAB — POCT ACTIVATED CLOTTING TIME
Activated Clotting Time: 335 seconds
Activated Clotting Time: 404 seconds
Activated Clotting Time: 306 seconds

## 2021-06-29 SURGERY — ATRIAL FIBRILLATION ABLATION
Anesthesia: General

## 2021-06-29 MED ORDER — PROPOFOL 10 MG/ML IV BOLUS
INTRAVENOUS | Status: DC | PRN
  Administered 2021-06-29: 130 mg via INTRAVENOUS

## 2021-06-29 MED ORDER — DEXAMETHASONE SODIUM PHOSPHATE 10 MG/ML IJ SOLN
INTRAMUSCULAR | Status: DC | PRN
  Administered 2021-06-29: 8 mg via INTRAVENOUS

## 2021-06-29 MED ORDER — ACETAMINOPHEN 500 MG PO TABS
1000.0000 mg | ORAL_TABLET | Freq: Once | ORAL | Status: AC
  Administered 2021-06-29: 1000 mg via ORAL

## 2021-06-29 MED ORDER — PHENYLEPHRINE HCL-NACL 20-0.9 MG/250ML-% IV SOLN
INTRAVENOUS | Status: DC | PRN
  Administered 2021-06-29: 25 ug/min via INTRAVENOUS

## 2021-06-29 MED ORDER — LIDOCAINE 2% (20 MG/ML) 5 ML SYRINGE
INTRAMUSCULAR | Status: DC | PRN
  Administered 2021-06-29: 80 mg via INTRAVENOUS

## 2021-06-29 MED ORDER — HEPARIN SODIUM (PORCINE) 1000 UNIT/ML IJ SOLN
INTRAMUSCULAR | Status: AC
  Filled 2021-06-29: qty 1

## 2021-06-29 MED ORDER — ROCURONIUM BROMIDE 10 MG/ML (PF) SYRINGE
PREFILLED_SYRINGE | INTRAVENOUS | Status: DC | PRN
  Administered 2021-06-29: 50 mg via INTRAVENOUS
  Administered 2021-06-29: 10 mg via INTRAVENOUS

## 2021-06-29 MED ORDER — ISOPROTERENOL HCL 0.2 MG/ML IJ SOLN
INTRAVENOUS | Status: DC | PRN
  Administered 2021-06-29: 20 ug/min via INTRAVENOUS

## 2021-06-29 MED ORDER — SODIUM CHLORIDE 0.9% FLUSH: 3.0000 mL | INTRAVENOUS | Status: DC | PRN

## 2021-06-29 MED ORDER — HEPARIN SODIUM (PORCINE) 1000 UNIT/ML IJ SOLN
INTRAMUSCULAR | Status: DC | PRN
  Administered 2021-06-29: 15000 [IU] via INTRAVENOUS
  Administered 2021-06-29: 1000 [IU] via INTRAVENOUS

## 2021-06-29 MED ORDER — SUGAMMADEX SODIUM 200 MG/2ML IV SOLN
INTRAVENOUS | Status: DC | PRN
  Administered 2021-06-29: 200 mg via INTRAVENOUS

## 2021-06-29 MED ORDER — HEPARIN (PORCINE) IN NACL 1000-0.9 UT/500ML-% IV SOLN
INTRAVENOUS | Status: AC
  Filled 2021-06-29: qty 500

## 2021-06-29 MED ORDER — PHENYLEPHRINE 40 MCG/ML (10ML) SYRINGE FOR IV PUSH (FOR BLOOD PRESSURE SUPPORT)
PREFILLED_SYRINGE | INTRAVENOUS | Status: DC | PRN
  Administered 2021-06-29: 120 ug via INTRAVENOUS
  Administered 2021-06-29 (×2): 80 ug via INTRAVENOUS
  Administered 2021-06-29 (×3): 120 ug via INTRAVENOUS

## 2021-06-29 MED ORDER — HEPARIN (PORCINE) IN NACL 1000-0.9 UT/500ML-% IV SOLN
INTRAVENOUS | Status: DC | PRN
  Administered 2021-06-29 (×3): 500 mL

## 2021-06-29 MED ORDER — FENTANYL CITRATE (PF) 250 MCG/5ML IJ SOLN
INTRAMUSCULAR | Status: DC | PRN
  Administered 2021-06-29: 75 ug via INTRAVENOUS

## 2021-06-29 MED ORDER — PANTOPRAZOLE SODIUM 40 MG PO TBEC: 40.0000 mg | DELAYED_RELEASE_TABLET | Freq: Every day | ORAL | 0 refills | Status: DC

## 2021-06-29 MED ORDER — ACETAMINOPHEN 500 MG PO TABS
ORAL_TABLET | ORAL | Status: AC
  Filled 2021-06-29: qty 2

## 2021-06-29 MED ORDER — ONDANSETRON HCL 4 MG/2ML IJ SOLN
INTRAMUSCULAR | Status: DC | PRN
  Administered 2021-06-29: 4 mg via INTRAVENOUS

## 2021-06-29 MED ORDER — ISOPROTERENOL HCL 0.2 MG/ML IJ SOLN
INTRAMUSCULAR | Status: AC
  Filled 2021-06-29: qty 5

## 2021-06-29 MED ORDER — SODIUM CHLORIDE 0.9% FLUSH: 3.0000 mL | Freq: Two times a day (BID) | INTRAVENOUS | Status: DC

## 2021-06-29 MED ORDER — PROTAMINE SULFATE 10 MG/ML IV SOLN
INTRAVENOUS | Status: DC | PRN
  Administered 2021-06-29 (×4): 10 mg via INTRAVENOUS

## 2021-06-29 MED ORDER — APIXABAN 5 MG PO TABS
5.0000 mg | ORAL_TABLET | Freq: Once | ORAL | Status: AC
  Administered 2021-06-29: 5 mg via ORAL
  Filled 2021-06-29: qty 1

## 2021-06-29 MED ORDER — SODIUM CHLORIDE 0.9 % IV SOLN: INTRAVENOUS | Status: DC

## 2021-06-29 MED ORDER — ACETAMINOPHEN 325 MG PO TABS: 650.0000 mg | ORAL_TABLET | ORAL | Status: DC | PRN

## 2021-06-29 MED ORDER — ONDANSETRON HCL 4 MG/2ML IJ SOLN: 4.0000 mg | Freq: Four times a day (QID) | INTRAMUSCULAR | Status: DC | PRN

## 2021-06-29 MED ORDER — SODIUM CHLORIDE 0.9 % IV SOLN: 250.0000 mL | INTRAVENOUS | Status: DC | PRN

## 2021-06-29 MED ORDER — HYDROCODONE-ACETAMINOPHEN 5-325 MG PO TABS: 1.0000 | ORAL_TABLET | ORAL | Status: DC | PRN

## 2021-06-29 SURGICAL SUPPLY — 16 items
CATH OCTARAY 2.0 F 3-3-3-3-3 (CATHETERS) ×3 IMPLANT
CATH SMTCH THERMOCOOL SF DF (CATHETERS) ×3 IMPLANT
CATH SOUNDSTAR ECO 8FR (CATHETERS) ×3 IMPLANT
CATH WEB BI DIR CSDF CRV REPRO (CATHETERS) ×3 IMPLANT
CLOSURE PERCLOSE PROSTYLE (VASCULAR PRODUCTS) ×9 IMPLANT
COVER SWIFTLINK CONNECTOR (BAG) ×3 IMPLANT
MAT PREVALON FULL STRYKER (MISCELLANEOUS) ×3 IMPLANT
NEEDLE BAYLIS TRANSSEPTAL 71CM (NEEDLE) ×3 IMPLANT
PACK EP LATEX FREE (CUSTOM PROCEDURE TRAY) ×3
PACK EP LF (CUSTOM PROCEDURE TRAY) ×1 IMPLANT
PATCH CARTO3 (PAD) ×3 IMPLANT
SHEATH PINNACLE 7F 10CM (SHEATH) ×6 IMPLANT
SHEATH PINNACLE 9F 10CM (SHEATH) ×3 IMPLANT
SHEATH PROBE COVER 6X72 (BAG) ×3 IMPLANT
SHEATH SWARTZ TS SL2 63CM 8.5F (SHEATH) ×3 IMPLANT
TUBING SMART ABLATE COOLFLOW (TUBING) ×3 IMPLANT

## 2021-06-29 NOTE — H&P (Signed)
  Antonio Curtis is a 80 y.o. male who presents today for electrophysiology study and ablation of afib.  Since last being seen in our clinic, the patient reports doing reaonably well.  His afib episodes have increased.  + palpitations and fatigue. Today, he denies symptoms of palpitations, chest pain, shortness of breath,  lower extremity edema, dizziness, presyncope, or syncope.  The patient is otherwise without complaint today.        Past Medical History:  Diagnosis Date   H/O partial resection of colon     Hypothyroid     Pacemaker      Biotronik Edora 8 DR-T   Paroxysmal atrial fibrillation (HCC)     Sick sinus syndrome (HCC)           Past Surgical History:  Procedure Laterality Date   ACHILLES TENDON REPAIR       APPENDECTOMY       ATRIAL FIBRILLATION ABLATION N/A 05/20/2020    Procedure: ATRIAL FIBRILLATION ABLATION;  Surgeon: [name], MD;  Location: [facility];  Service: Cardiovascular;  Laterality: N/A;   COLON SURGERY        Hemicolectomy:  Polyp.     PACEMAKER IMPLANT        Biotronik PPM implanted in [place] by Dr [name].   TONSILLECTOMY          ROS- all systems are reviewed and negative except as per HPI above         Current Outpatient Medications  Medication Sig Dispense Refill   ELIQUIS 5 MG TABS tablet TAKE 1 TABLET BY MOUTH EACH MORNING AND EVENING AS DIRECTED (NEEDS TO SCHEDULE F/U APPT) 180 tablet 1   levothyroxine (SYNTHROID) 88 MCG tablet Take 88 mcg by mouth daily.       Metoprolol Tartrate 37.5 MG TABS TAKE 1 TABALET BY MOUTH 2 (TWO) TIMES DAILY. 180 tablet 3    No current facility-administered medications for this visit.      Physical Exam: Vitals:   06/29/21 1052  BP: 139/78  Pulse: 74  Resp: 15  Temp: 97.9 F (36.6 C)  SpO2: 100%    GEN- The patient is well appearing, alert and oriented x 3 today.   Head- normocephalic, atraumatic Eyes-  Sclera clear, conjunctiva pink Ears- hearing intact Oropharynx- clear Lungs- Clear  to ausculation bilaterally, normal work of breathing Chest- pacemaker pocket is well healed Heart- Regular rate and rhythm, no murmurs, rubs or gallops, PMI not laterally displaced GI- soft, NT, ND, + BS Extremities- no clubbing, cyanosis, or edema    Assessment and Plan:   Persistent afib AF burden has increased from 3% last visit to 15% The patient has symptomatic, recurrent  atrial fibrillation post ablation 2021. he has failed medical therapy with flecainide. Chads2vasc score is at least 2.  he is anticoagulated with eliquis .   Risk, benefits, and alternatives to EP study and radiofrequency ablation for afib were again discussed in detail today. These risks include but are not limited to stroke, bleeding, vascular damage, tamponade, perforation, damage to the esophagus, lungs, and other structures, pulmonary vein stenosis, pacemaker lead dislodgement requiring revision, worsening renal function, and death. The patient understands these risk and wishes to proceed.    Cardiac CT reviewed at length with the patient today.  he reports compliance with OAC without interruption.  [name] MD, [facility] 06/29/2021 11:38 AM

## 2021-06-29 NOTE — Discharge Instructions (Signed)
Post procedure care instructions No driving for 4 days. No lifting over 5 lbs for 1 week. No vigorous or sexual activity for 1 week. You may return to work/your usual activities on 07/07/21. Keep procedure site clean & dry. If you notice increased pain, swelling, bleeding or pus, call/return!  You may shower after 24 hours, but no soaking in baths/hot tubs/pools for 1 week.    You have an appointment set up with the Atrial Fibrillation Clinic.  Multiple studies have shown that being followed by a dedicated atrial fibrillation clinic in addition to the standard care you receive from your other physicians improves health. We believe that enrollment in the atrial fibrillation clinic will allow [place] to better care for you.   The phone number to the Atrial Fibrillation Clinic is [phone]. The clinic is staffed Monday through Friday from 8:30am to 5pm.  Parking Directions: The clinic is located in the Heart and Vascular Building connected to [facility]. 1)From [address] turn on to [address] and go to the 3rd entrance  (Heart and Vascular entrance) on the right. 2)Look to the right for Heart &Vascular Parking Garage. 3)A code for the entrance is required, for Nov. Is 4444.   4)Take the elevators to the 1st floor. Registration is in the room with the glass walls at the end of the hallway.  If you have any trouble parking or locating the clinic, please don't hesitate to call [phone].

## 2021-06-29 NOTE — Anesthesia Procedure Notes (Signed)
Procedure Name: Intubation Date/Time: 06/29/2021 12:53 PM Performed by: Lovie Chol, CRNA Pre-anesthesia Checklist: Patient identified, Emergency Drugs available, Suction available and Patient being monitored Patient Re-evaluated:Patient Re-evaluated prior to induction Oxygen Delivery Method: Circle System Utilized Preoxygenation: Pre-oxygenation with 100% oxygen Induction Type: IV induction Ventilation: Mask ventilation without difficulty Laryngoscope Size: Glidescope and 4 Grade View: Grade I Tube type: Oral Tube size: 7.5 mm Number of attempts: 1 Airway Equipment and Method: Stylet, Oral airway and Video-laryngoscopy Placement Confirmation: ETT inserted through vocal cords under direct vision, positive ETCO2 and breath sounds checked- equal and bilateral Secured at: 22 cm Tube secured with: Tape Dental Injury: Teeth and Oropharynx as per pre-operative assessment  Difficulty Due To: Difficulty was anticipated and Difficult Airway- due to anterior larynx

## 2021-06-29 NOTE — Transfer of Care (Signed)
Immediate Anesthesia Transfer of Care Note  Patient: Antonio Curtis  Procedure(s) Performed: ATRIAL FIBRILLATION ABLATION  Patient Location: PACU and Cath Lab  Anesthesia Type:General  Level of Consciousness: oriented, drowsy and patient cooperative  Airway & Oxygen Therapy: Patient Spontanous Breathing and Patient connected to nasal cannula oxygen  Post-op Assessment: Report given to RN and Post -op Vital signs reviewed and stable  Post vital signs: Reviewed  Last Vitals:  Vitals Value Taken Time  BP 96/42 06/29/21 1509  Temp 36.4 C 06/29/21 1507  Pulse 66 06/29/21 1511  Resp 14 06/29/21 1511  SpO2 100 % 06/29/21 1511  Vitals shown include unvalidated device data.  Last Pain:  Vitals:   06/29/21 1507  TempSrc: Temporal  PainSc: Asleep      Patients Stated Pain Goal: 3 (06/29/21 1117)  Complications: No notable events documented.

## 2021-06-29 NOTE — Anesthesia Preprocedure Evaluation (Addendum)
Anesthesia Evaluation  Patient identified by MRN, date of birth, ID band Patient awake    Reviewed: Allergy & Precautions, NPO status , Patient's Chart, lab work & pertinent test results, reviewed documented beta blocker date and time   History of Anesthesia Complications Negative for: history of anesthetic complications  Airway Mallampati: III  TM Distance: <3 FB Neck ROM: Full    Dental  (+) Teeth Intact, Dental Advisory Given   Pulmonary sleep apnea ,    Pulmonary exam normal breath sounds clear to auscultation       Cardiovascular hypertension, Pt. on home beta blockers Normal cardiovascular exam+ dysrhythmias Atrial Fibrillation + pacemaker  Rhythm:Regular Rate:Normal     Neuro/Psych negative neurological ROS  negative psych ROS   GI/Hepatic negative GI ROS, Neg liver ROS,   Endo/Other  Hypothyroidism   Renal/GU negative Renal ROS     Musculoskeletal negative musculoskeletal ROS (+)   Abdominal   Peds  Hematology  (+) Blood dyscrasia (Eliquis), ,   Anesthesia Other Findings Day of surgery medications reviewed with the patient.  Reproductive/Obstetrics                            Anesthesia Physical Anesthesia Plan  ASA: 3  Anesthesia Plan: General   Post-op Pain Management:    Induction: Intravenous  PONV Risk Score and Plan: 2 and Dexamethasone and Ondansetron  Airway Management Planned: Oral ETT and Video Laryngoscope Planned  Additional Equipment:   Intra-op Plan:   Post-operative Plan: Extubation in OR  Informed Consent: I have reviewed the patients History and Physical, chart, labs and discussed the procedure including the risks, benefits and alternatives for the proposed anesthesia with the patient or authorized representative who has indicated his/her understanding and acceptance.     Dental advisory given  Plan Discussed with: CRNA  Anesthesia Plan  Comments:        Anesthesia Quick Evaluation

## 2021-06-30 ENCOUNTER — Encounter (HOSPITAL_COMMUNITY): Payer: Self-pay | Admitting: Internal Medicine

## 2021-06-30 NOTE — Anesthesia Postprocedure Evaluation (Signed)
Anesthesia Post Note  Patient: [place]  Procedure(s) Performed: ATRIAL FIBRILLATION ABLATION     Patient location during evaluation: Cath Lab Anesthesia Type: General Level of consciousness: awake and alert Pain management: pain level controlled Vital Signs Assessment: post-procedure vital signs reviewed and stable Respiratory status: spontaneous breathing, nonlabored ventilation and respiratory function stable Cardiovascular status: blood pressure returned to baseline and stable Postop Assessment: no apparent nausea or vomiting Anesthetic complications: no   No notable events documented.  Last Vitals:  Vitals:   06/29/21 1730 06/29/21 1800  BP: (!) 116/57 (!) 111/54  Pulse: 70 75  Resp: 18 15  Temp:    SpO2: 97% 96%    Last Pain:  Vitals:   06/29/21 1600  TempSrc:   PainSc: 0-No pain                 Cecile Hearing

## 2021-07-04 ENCOUNTER — Ambulatory Visit (INDEPENDENT_AMBULATORY_CARE_PROVIDER_SITE_OTHER): Payer: Medicare Other

## 2021-07-04 DIAGNOSIS — I495 Sick sinus syndrome: Secondary | ICD-10-CM | POA: Diagnosis not present

## 2021-07-04 LAB — CUP PACEART REMOTE DEVICE CHECK
Date Time Interrogation Session: 20221101162814
Implantable Lead Implant Date: 20181212
Implantable Lead Implant Date: 20181212
Implantable Lead Location: 753859
Implantable Lead Location: 753860
Implantable Lead Model: 377
Implantable Lead Model: 377
Implantable Lead Serial Number: 80523187
Implantable Lead Serial Number: 80573594
Implantable Pulse Generator Implant Date: 20181212
Pulse Gen Model: 407145
Pulse Gen Serial Number: 69192108

## 2021-07-05 ENCOUNTER — Other Ambulatory Visit: Payer: Self-pay | Admitting: Cardiology

## 2021-07-11 NOTE — Progress Notes (Signed)
Remote pacemaker transmission.   

## 2021-07-31 ENCOUNTER — Ambulatory Visit (HOSPITAL_COMMUNITY): Payer: Medicare Other | Admitting: Physician Assistant

## 2021-08-05 ENCOUNTER — Other Ambulatory Visit: Payer: Self-pay | Admitting: Internal Medicine

## 2021-08-07 ENCOUNTER — Other Ambulatory Visit: Payer: Self-pay | Admitting: *Deleted

## 2021-08-07 NOTE — Telephone Encounter (Signed)
Pt's pharmacy is requesting a refill on pantoprazole. Would Dr. Allred like to refill this medication? Please address 

## 2021-08-09 ENCOUNTER — Encounter (HOSPITAL_COMMUNITY): Payer: Self-pay | Admitting: Physician Assistant

## 2021-08-09 ENCOUNTER — Other Ambulatory Visit: Payer: Self-pay

## 2021-08-09 ENCOUNTER — Ambulatory Visit (HOSPITAL_COMMUNITY)
Admission: RE | Admit: 2021-08-09 | Discharge: 2021-08-09 | Disposition: A | Discharge status: Home / Self Care | Payer: Medicare Other | Source: Ambulatory Visit | Attending: Internal Medicine | Admitting: Internal Medicine

## 2021-08-09 VITALS — BP 134/68 | HR 68 | Ht 72.0 in | Wt 186.2 lb

## 2021-08-09 DIAGNOSIS — Z79899 Other long term (current) drug therapy: Secondary | ICD-10-CM | POA: Diagnosis not present

## 2021-08-09 DIAGNOSIS — Z950 Presence of cardiac pacemaker: Secondary | ICD-10-CM | POA: Insufficient documentation

## 2021-08-09 DIAGNOSIS — I480 Paroxysmal atrial fibrillation: Secondary | ICD-10-CM | POA: Insufficient documentation

## 2021-08-09 DIAGNOSIS — Z9989 Dependence on other enabling machines and devices: Secondary | ICD-10-CM | POA: Diagnosis not present

## 2021-08-09 DIAGNOSIS — Z7901 Long term (current) use of anticoagulants: Secondary | ICD-10-CM | POA: Diagnosis not present

## 2021-08-09 DIAGNOSIS — G4733 Obstructive sleep apnea (adult) (pediatric): Secondary | ICD-10-CM | POA: Diagnosis not present

## 2021-08-09 DIAGNOSIS — I495 Sick sinus syndrome: Secondary | ICD-10-CM | POA: Insufficient documentation

## 2021-08-09 DIAGNOSIS — E039 Hypothyroidism, unspecified: Secondary | ICD-10-CM | POA: Diagnosis not present

## 2021-08-09 DIAGNOSIS — D6869 Other thrombophilia: Secondary | ICD-10-CM | POA: Insufficient documentation

## 2021-08-09 NOTE — Progress Notes (Signed)
Primary Care Physician: Marden Noble, MD Primary Cardiologist: Dr Antoine Poche  Primary Electrophysiologist: Dr Graciela Husbands Referring Physician: Dr Johney Frame   [place] is a 80 y.o. male with a history of SSS s/p PPM, hypothyroid, OSA, and paroxysmal atrial fibrillation who presents for follow up in the [facility]. He reports initially being diagnosed with atrial fibrillation in 2016 after presenting to the ER with numbness down his L side (face, arm and leg) numbness.  He was noted to have afib at the time.  He was also found to be dehydrated at that time. He was placed on flecainide and eliquis.  He did well initially.  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  He had Biotronik PPM implanted by Dr [facility] in [place].  He moved to [facility] 07/2019 and has been followed by Dr Graciela Husbands since that time.  Unfortunately, he began having increasing frequency and duration of atrial fibrillation.  Flecainide was switched to rhythmol, with some improvement in his afib. Not felt to be a good candidate for sotalol or dofetilide with QT prolongation. He is now s/p afib ablation with Dr Johney Frame on 05/20/20. Patient is on Eliquis for a CHADS2VASC score of 2.   On follow up today, patient is s/p repeat afib ablation with Dr Johney Frame on 06/29/21. He states that for the first 2-3 weeks post ablation he had several episodes of afib lasting several hours. In the last 2 weeks this has improved. He denies CP, swallowing pain, or groin issues.   Today, he denies symptoms of chest pain, orthopnea, PND, lower extremity edema, dizziness, presyncope, syncope, bleeding, or neurologic sequela. The patient is tolerating medications without difficulties and is otherwise without complaint today.    Atrial Fibrillation Risk Factors:  he does have symptoms or diagnosis of sleep apnea. he is compliant with CPAP therapy. he does not have a  history of rheumatic fever.   he has a BMI of Body mass index is 25.25 kg/m.Marland Kitchen Filed Weights   08/09/21 1115  Weight: 84.5 kg     Family History  Problem Relation Age of Onset   Heart disease Father 48   Diabetes Father      Atrial Fibrillation Management history:  Previous antiarrhythmic drugs: flecainide, propafenone  Previous cardioversions: none Previous ablations: 05/20/20, 06/29/21 CHADS2VASC score: 2 Anticoagulation history: Eliquis   Past Medical History:  Diagnosis Date   H/O partial resection of colon    Hypothyroid    Pacemaker    Biotronik Edora 8 DR-T   Paroxysmal atrial fibrillation (HCC)    Sick sinus syndrome (HCC)    Past Surgical History:  Procedure Laterality Date   ACHILLES TENDON REPAIR     APPENDECTOMY     ATRIAL FIBRILLATION ABLATION N/A 05/20/2020   Procedure: ATRIAL FIBRILLATION ABLATION;  Surgeon: [name], MD;  [facility];  Service: Cardiovascular;  Laterality: N/A;   ATRIAL FIBRILLATION ABLATION N/A 06/29/2021   Procedure: ATRIAL FIBRILLATION ABLATION;  Surgeon: [name], MD;  [facility]: [facility];  Service: Cardiovascular;  Laterality: N/A;   COLON SURGERY     Hemicolectomy:  Polyp.     PACEMAKER IMPLANT     Biotronik PPM implanted in [place] by Dr [name].   TONSILLECTOMY      Current Outpatient Medications  Medication Sig Dispense Refill   diphenhydrAMINE (BENADRYL) 25 MG tablet Take 25 mg by mouth at bedtime as needed for sleep.  ELIQUIS 5 MG TABS tablet TAKE 1 TABLET BY MOUTH EACH MORNING AND EVENING AS DIRECTED (NEEDS TO SCHEDULE F/U APPT) 180 tablet 1   levothyroxine (SYNTHROID) 88 MCG tablet Take 88 mcg by mouth daily before breakfast.     Metoprolol Tartrate 37.5 MG TABS TAKE 1 TABALET BY MOUTH 2 (TWO) TIMES DAILY. 180 tablet 3   No current facility-administered medications for this encounter.    No Known Allergies  Social History   Socioeconomic History   Marital status: Married     Spouse name: Not on file   Number of children: Not on file   Years of education: Not on file   Highest education level: Not on file  Occupational History   Not on file  Tobacco Use   Smoking status: Never   Smokeless tobacco: Never  Vaping Use   Vaping Use: Never used  Substance and Sexual Activity   Alcohol use: Yes    Alcohol/week: 1.0 - 2.0 standard drink    Types: 1 - 2 Glasses of wine per week    Comment: monthly   Drug use: Never   Sexual activity: Not on file  Other Topics Concern   Not on file  Social History Narrative   Five children.  7 Grand.  Retired Education officer, community.   Lives in [place] with spouse.   Social Determinants of Health   Financial Resource Strain: Not on file  Food Insecurity: Not on file  Transportation Needs: Not on file  Physical Activity: Not on file  Stress: Not on file  Social Connections: Not on file  Intimate Partner Violence: Not on file     ROS- All systems are reviewed and negative except as per the HPI above.  Physical Exam: Vitals:   08/09/21 1115  BP: 134/68  Pulse: 68  Weight: 84.5 kg  Height: 6' (1.829 m)    GEN- The patient is a well appearing elderly male, alert and oriented x 3 today.   HEENT-head normocephalic, atraumatic, sclera clear, conjunctiva pink, hearing intact, trachea midline. Lungs- Clear to ausculation bilaterally, normal work of breathing Heart- Regular rate and rhythm, no murmurs, rubs or gallops  GI- soft, NT, ND, + BS Extremities- no clubbing, cyanosis, or edema MS- no significant deformity or atrophy Skin- no rash or lesion Psych- euthymic mood, full affect Neuro- strength and sensation are intact   Wt Readings from Last 3 Encounters:  08/09/21 84.5 kg  06/29/21 83 kg  05/18/21 81.3 kg    EKG today demonstrates  SR Vent. rate 68 BPM PR interval 194 ms QRS duration 100 ms QT/QTcB 418/444 ms  Echo 04/20/20 demonstrated  1. Left ventricular ejection fraction, by estimation, is 60 to 65%.  The  left ventricle has normal function. The left ventricle has no regional  wall motion abnormalities. Left ventricular diastolic parameters are  indeterminate.   2. Right ventricular systolic function is normal. The right ventricular  size is normal. There is normal pulmonary artery systolic pressure.   3. Left atrial size was mildly dilated.   4. The mitral valve is normal in structure. Trivial mitral valve  regurgitation. No evidence of mitral stenosis.   5. The aortic valve is tricuspid. Aortic valve regurgitation is not  visualized. No aortic stenosis is present.   6. Aortic dilatation noted. Aneurysm of the ascending aorta, measuring 37  mm. There is mild dilatation at the level of the sinuses of Valsalva  measuring 38 mm.   7. The inferior vena cava is normal  in size with greater than 50%  respiratory variability, suggesting right atrial pressure of 3 mmHg.   Epic records are reviewed at length today  CHA2DS2-VASc Score = 2  The patient's score is based upon: CHF History: 0 HTN History: 0 Diabetes History: 0 Stroke History: 0 Vascular Disease History: 0 Age Score: 2 Gender Score: 0       ASSESSMENT AND PLAN: 1. Paroxysmal Atrial Fibrillation (ICD10:  I48.0) The patient's CHA2DS2-VASc score is 2, indicating a 2.2% annual risk of stroke.   S/p afib ablation with Dr Rayann Heman on 05/20/20 with repeat ablation 06/29/21. Patient has had several episodes of afib since the procedure but less frequently recently. Offered to increase BB, patient prefers to continue present therapy for now. Hopefully, the frequency of his episodes will continue to improve post ablation.  Continue Eliquis 5 mg BID Continue Lopressor 37.5 mg BID  2. Secondary Hypercoagulable State (ICD10:  D68.69) The patient is at significant risk for stroke/thromboembolism based upon his CHA2DS2-VASc Score of 2.  Continue Apixaban (Eliquis).   3. Obstructive sleep apnea Patient has appointment with pulmonology  12/12 to discuss resuming CPAP.   4. SSS S/p PPM, followed by Dr Caryl Comes and the device clinic.   Follow up with Dr Rayann Heman as scheduled.    [facility] [address] [place], [place] [address] [phone] 08/09/2021 11:45 AM

## 2021-08-14 ENCOUNTER — Encounter: Payer: Self-pay | Admitting: Pulmonary Disease

## 2021-08-14 ENCOUNTER — Ambulatory Visit: Payer: Medicare Other | Admitting: Pulmonary Disease

## 2021-08-14 ENCOUNTER — Other Ambulatory Visit: Payer: Self-pay

## 2021-08-14 VITALS — BP 126/80 | HR 69 | Temp 97.8°F | Ht 72.0 in | Wt 181.0 lb

## 2021-08-14 DIAGNOSIS — G4733 Obstructive sleep apnea (adult) (pediatric): Secondary | ICD-10-CM

## 2021-08-14 DIAGNOSIS — R0602 Shortness of breath: Secondary | ICD-10-CM | POA: Diagnosis not present

## 2021-08-14 NOTE — Patient Instructions (Signed)
History of moderate obstructive sleep apnea-diagnosed May 2021  -Last study- titration study was to BiPAP-titrated to BiPAP of 12/8-study was 05/28/2020 -CPAP was not well-tolerated  -We will schedule you for an in lab titration study  -I will see you back in 6 to 8 weeks  -Call with significant concerns   Sleep Apnea Sleep apnea affects breathing during sleep. It causes breathing to stop for 10 seconds or more, or to become shallow. People with sleep apnea usually snore loudly. It can also increase the risk of: Heart attack. Stroke. Being very overweight (obese). Diabetes. Heart failure. Irregular heartbeat. High blood pressure. The goal of treatment is to help you breathe normally again. What are the causes? The most common cause of this condition is a collapsed or blocked airway. There are three kinds of sleep apnea: Obstructive sleep apnea. This is caused by a blocked or collapsed airway. Central sleep apnea. This happens when the brain does not send the right signals to the muscles that control breathing. Mixed sleep apnea. This is a combination of obstructive and central sleep apnea. What increases the risk? Being overweight. Smoking. Having a small airway. Being older. Being male. Drinking alcohol. Taking medicines to calm yourself (sedatives or tranquilizers). Having family members with the condition. Having a tongue or tonsils that are larger than normal. What are the signs or symptoms? Trouble staying asleep. Loud snoring. Headaches in the morning. Waking up gasping. Dry mouth or sore throat in the morning. Being sleepy or tired during the day. If you are sleepy or tired during the day, you may also: Not be able to focus your mind (concentrate). Forget things. Get angry a lot and have mood swings. Feel sad (depressed). Have changes in your personality. Have less interest in sex, if you are male. Be unable to have an erection, if you are male. How is  this treated?  Sleeping on your side. Using a medicine to get rid of mucus in your nose (decongestant). Avoiding the use of alcohol, medicines to help you relax, or certain pain medicines (narcotics). Losing weight, if needed. Changing your diet. Quitting smoking. Using a machine to open your airway while you sleep, such as: An oral appliance. This is a mouthpiece that shifts your lower jaw forward. A CPAP device. This device blows air through a mask when you breathe out (exhale). An EPAP device. This has valves that you put in each nostril. A BIPAP device. This device blows air through a mask when you breathe in (inhale) and breathe out. Having surgery if other treatments do not work. Follow these instructions at home: Lifestyle Make changes that your doctor recommends. Eat a healthy diet. Lose weight if needed. Avoid alcohol, medicines to help you relax, and some pain medicines. Do not smoke or use any products that contain nicotine or tobacco. If you need help quitting, ask your doctor. General instructions Take over-the-counter and prescription medicines only as told by your doctor. If you were given a machine to use while you sleep, use it only as told by your doctor. If you are having surgery, make sure to tell your doctor you have sleep apnea. You may need to bring your device with you. Keep all follow-up visits. Contact a doctor if: The machine that you were given to use during sleep bothers you or does not seem to be working. You do not get better. You get worse. Get help right away if: Your chest hurts. You have trouble breathing in enough air. You have  an uncomfortable feeling in your back, arms, or stomach. You have trouble talking. One side of your body feels weak. A part of your face is hanging down. These symptoms may be an emergency. Get help right away. Call your local emergency services (911 in the [place] Do not wait to see if the symptoms will go away. Do not  drive yourself to the hospital. Summary This condition affects breathing during sleep. The most common cause is a collapsed or blocked airway. The goal of treatment is to help you breathe normally while you sleep. This information is not intended to replace advice given to you by your health care provider. Make sure you discuss any questions you have with your health care provider. Document Revised: 03/29/2021 Document Reviewed: 07/29/2020 Elsevier Patient Education  2022 [employer].

## 2021-08-14 NOTE — Progress Notes (Signed)
[place]    [id number]    [birth year]  Primary Care Physician:Gates, Molly Maduro, MD  Referring Physician: Marden Noble, MD [address] [place],  [place] [address]  Chief complaint:   Patient with history of obstructive sleep apnea  HPI:  History of obstructive sleep apnea diagnosed in May 2021 -AHI of 17.5, mild oxygen desaturations  Was started on CPAP and use CPAP for 2 to 3 months -Did not have difficulty with the masks, had several mask changes -The mask however always broke seal after about an hour of keeping it on  BiPAP was discussed as an option of treatment and patient had a titration study to BiPAP -he was advised that follow-up will be made but did not have any follow-up  Could not tolerate the CPAP  Has had issues with atrial fibrillation Recently had repeat ablation  -Concerned with untreated sleep disordered breathing with ongoing atrial fibrillation  History of snoring, no witnessed apneas Admits to daytime sleepiness Occasional dryness of his mouth in the mornings No morning headaches No night sweats Usually goes to bed about 11 PM, falls asleep easily, multiple awakenings, final wake up time about 6 AM Sleep is nonrestorative  No family history of sleep apnea  He is active   Outpatient Encounter Medications as of 08/14/2021  Medication Sig   diphenhydrAMINE (BENADRYL) 25 MG tablet Take 25 mg by mouth at bedtime as needed for sleep.   ELIQUIS 5 MG TABS tablet TAKE 1 TABLET BY MOUTH EACH MORNING AND EVENING AS DIRECTED (NEEDS TO SCHEDULE F/U APPT)   levothyroxine (SYNTHROID) 88 MCG tablet Take 88 mcg by mouth daily before breakfast.   Metoprolol Tartrate 37.5 MG TABS TAKE 1 TABALET BY MOUTH 2 (TWO) TIMES DAILY.   No facility-administered encounter medications on file as of 08/14/2021.    Allergies as of 08/14/2021   (No Known Allergies)    Past Medical History:  Diagnosis Date   H/O partial resection of colon     Hypothyroid    Pacemaker    Biotronik Edora 8 DR-T   Paroxysmal atrial fibrillation (HCC)    Sick sinus syndrome (HCC)     Past Surgical History:  Procedure Laterality Date   ACHILLES TENDON REPAIR     APPENDECTOMY     ATRIAL FIBRILLATION ABLATION N/A 05/20/2020   Procedure: ATRIAL FIBRILLATION ABLATION;  Surgeon: [name], MD;  [facility];  Service: Cardiovascular;  Laterality: N/A;   ATRIAL FIBRILLATION ABLATION N/A 06/29/2021   Procedure: ATRIAL FIBRILLATION ABLATION;  Surgeon: [name], MD;  Location: [facility];  Service: Cardiovascular;  Laterality: N/A;   COLON SURGERY     Hemicolectomy:  Polyp.     PACEMAKER IMPLANT     Biotronik PPM implanted in [place] by Dr [name].   TONSILLECTOMY      Family History  Problem Relation Age of Onset   Heart disease Father 78   Diabetes Father     Social History   Socioeconomic History   Marital status: Married    Spouse name: Not on file   Number of children: Not on file   Years of education: Not on file   Highest education level: Not on file  Occupational History   Not on file  Tobacco Use   Smoking status: Never   Smokeless tobacco: Never  Vaping Use   Vaping Use: Never used  Substance and Sexual Activity   Alcohol use: Yes  Alcohol/week: 1.0 - 2.0 standard drink    Types: 1 - 2 Glasses of wine per week    Comment: monthly   Drug use: Never   Sexual activity: Not on file  Other Topics Concern   Not on file  Social History Narrative   Five children.  7 Grand.  Retired Education officer, community.   Lives in [place] with spouse.   Social Determinants of Health   Financial Resource Strain: Not on file  Food Insecurity: Not on file  Transportation Needs: Not on file  Physical Activity: Not on file  Stress: Not on file  Social Connections: Not on file  Intimate Partner Violence: Not on file    Review of Systems  Constitutional:  Negative for fatigue.  Respiratory:  Negative for  shortness of breath.   Cardiovascular:  Negative for chest pain.  Psychiatric/Behavioral:  Positive for sleep disturbance.    There were no vitals filed for this visit.   Physical Exam Constitutional:      Appearance: Normal appearance.  HENT:     Head: Normocephalic.     Nose: No congestion.     Mouth/Throat:     Mouth: Mucous membranes are moist.  Eyes:     Pupils: Pupils are equal, round, and reactive to light.  Cardiovascular:     Rate and Rhythm: Normal rate and regular rhythm.     Heart sounds: No murmur heard.   No friction rub.  Pulmonary:     Effort: No respiratory distress.     Breath sounds: No stridor. No wheezing or rhonchi.  Musculoskeletal:     Cervical back: No rigidity or tenderness.  Neurological:     Mental Status: He is alert.  Psychiatric:        Mood and Affect: Mood normal.   Results of the Epworth flowsheet 08/14/2021  Sitting and reading 3  Watching TV 2  Sitting, inactive in a public place (e.g. a theatre or a meeting) 0  As a passenger in a car for an hour without a break 3  Lying down to rest in the afternoon when circumstances permit 3  Sitting and talking to someone 0  Sitting quietly after a lunch without alcohol 2  In a car, while stopped for a few minutes in traffic 0  Total score 13     Data Reviewed: Sleep study from April 2021 shows moderate obstructive sleep apnea with mild oxygen desaturations  Titration study-titrated to BiPAP 12/8-9/25/2021 -Did not start BiPAP therapy  Assessment:  Excessive daytime sleepiness  Moderate obstructive sleep apnea  Atrial fibrillation  Past COVID infection will complete recovery  Pathophysiology of sleep disordered breathing discussed with the patient  Treatment options discussed with the patient  Difference between BiPAP and CPAP discussed  Plan/Recommendations:  Patient has moderate obstructive sleep apnea, atrial fibrillation that is now controlled at present with recent repeat  ablation-still  following up for A. fib  Adequately addressing sleep disordered breathing will at least reduce the risk that untreated sleep apnea poses to persistence and recurrence of atrial fibrillation  We will schedule patient for titration study  I will see him back in about 6 to 8 weeks  Encouraged to call with any significant concerns  Virl Diamond MD [facility] 08/14/2021, 3:56 PM  CC: Marden Noble, MD

## 2021-08-15 ENCOUNTER — Encounter: Payer: Self-pay | Admitting: Pulmonary Disease

## 2021-08-21 ENCOUNTER — Encounter: Payer: Medicare Other | Admitting: Internal Medicine

## 2021-09-11 ENCOUNTER — Other Ambulatory Visit: Payer: Self-pay

## 2021-09-11 ENCOUNTER — Encounter (INDEPENDENT_AMBULATORY_CARE_PROVIDER_SITE_OTHER): Payer: Self-pay

## 2021-09-11 ENCOUNTER — Ambulatory Visit (HOSPITAL_BASED_OUTPATIENT_CLINIC_OR_DEPARTMENT_OTHER): Payer: Medicare Other | Attending: Pulmonary Disease | Admitting: Pulmonary Disease

## 2021-09-11 DIAGNOSIS — G4733 Obstructive sleep apnea (adult) (pediatric): Secondary | ICD-10-CM | POA: Insufficient documentation

## 2021-09-11 DIAGNOSIS — R0602 Shortness of breath: Secondary | ICD-10-CM | POA: Diagnosis not present

## 2021-09-24 ENCOUNTER — Telehealth: Payer: Self-pay | Admitting: Pulmonary Disease

## 2021-09-24 DIAGNOSIS — G4733 Obstructive sleep apnea (adult) (pediatric): Secondary | ICD-10-CM

## 2021-09-24 NOTE — Procedures (Signed)
Patient Name: Antonio Curtis, Everage Date: 09/11/2021 Gender: Male D.O.B: 29-May-1941 Age (years): 81 Referring Provider: Fransico Him MD, ABSM Height (inches): 72 Interpreting Physician: Fransico Him MD, ABSM Weight (lbs): 185 RPSGT: Laren Everts BMI: 25 MRN: [id number] Neck Size: 15.75 <br> <br> CLINICAL INFORMATION The patient is referred for a BiPAP titration to treat sleep apnea.    Date of NPSG, Split Night or HST:  SLEEP STUDY TECHNIQUE As per the AASM Manual for the Scoring of Sleep and Associated Events v2.3 (April 2016) with a hypopnea requiring 4% desaturations.  The channels recorded and monitored were frontal, central and occipital EEG, electrooculogram (EOG), submentalis EMG (chin), nasal and oral airflow, thoracic and abdominal wall motion, anterior tibialis EMG, snore microphone, electrocardiogram, and pulse oximetry. Bilevel positive airway pressure (BPAP) was initiated at the beginning of the study and titrated to treat sleep-disordered breathing.  MEDICATIONS Medications self-administered by patient taken the night of the study : LEVOTHYROXINE  RESPIRATORY PARAMETERS Optimal IPAP Pressure (cm): 15 AHI at Optimal Pressure (/hr) 2.3 Optimal EPAP Pressure (cm): 11   Overall Minimal O2 (%): 90.0 Minimal O2 at Optimal Pressure (%): 92.0 SLEEP ARCHITECTURE Start Time: 10:37:53 PM Stop Time: 5:23:55 AM Total Time (min): 406 Total Sleep Time (min): 301.5 Sleep Latency (min): 6.1 Sleep Efficiency (%): 74.3% REM Latency (min): 51.5 WASO (min): 98.4 Stage N1 (%): 17.2% Stage N2 (%): 66.2% Stage N3 (%): 0.0% Stage R (%): 16.6 Supine (%): 17.08 Arousal Index (/hr): 22.1     CARDIAC DATA The 2 lead EKG demonstrated sinus rhythm. The mean heart rate was 65.2 beats per minute. Other EKG findings include: PVCs. LEG MOVEMENT DATA The total Periodic Limb Movements of Sleep (PLMS) were 0. The PLMS index was 0.0. A PLMS index of <15 is considered normal in  adults.  IMPRESSIONS - An optimal PAP pressure was selected for this patient ( 15 /11 cm of water) - Central sleep apnea was not noted during this titration (CAI = 4/h). - Significant oxygen desaturations were not observed during this titration (min O2 = 90.0%). - No snoring was audible during this study. - 2-lead EKG demonstrated: PVCs - Clinically significant periodic limb movements were not noted during this study. Arousals associated with PLMs were rare.  DIAGNOSIS - Obstructive Sleep Apnea (G47.33)  RECOMMENDATIONS - Trial of BiPAP therapy on 15/11 cm H2O with a Medium size Resmed Full Face Mask Mirage Quattro mask and heated humidification. - Avoid alcohol, sedatives and other CNS depressants that may worsen sleep apnea and disrupt normal sleep architecture. - Sleep hygiene should be reviewed to assess factors that may improve sleep quality. - Weight management and regular exercise should be initiated or continued. - Follow-up as previously scheduled  [Electronically signed] 09/24/2021 01:37 PM  Sherrilyn Rist MD NPI: [id number]

## 2021-09-24 NOTE — Telephone Encounter (Signed)
Call patient  Sleep study result  Date of study: 09/11/2020  Impression: Moderate obstructive sleep apnea adequately treated with BiPAP therapy  Recommendation: DME referral  Trial of BiPAP therapy on 15/11 cm H2O with a Medium size Resmed Full Face Mask Mirage Quattro mask and heated humidification.  Encourage weight loss measures  Follow-up in the office 4 to 6 weeks following initiation of treatment

## 2021-09-27 NOTE — Telephone Encounter (Signed)
I called the patient and he is agreeable to the Bipap therapy. I am placing the order for the patient. Order has been placed and he wants to keep an Enetai on 10/02/21.

## 2021-10-02 ENCOUNTER — Encounter: Payer: Medicare Other | Admitting: Internal Medicine

## 2021-10-02 ENCOUNTER — Other Ambulatory Visit: Payer: Self-pay

## 2021-10-02 ENCOUNTER — Ambulatory Visit: Payer: Medicare Other | Admitting: Pulmonary Disease

## 2021-10-02 ENCOUNTER — Encounter: Payer: Self-pay | Admitting: Pulmonary Disease

## 2021-10-02 VITALS — BP 118/64 | HR 68 | Temp 98.4°F | Ht 72.0 in | Wt 178.0 lb

## 2021-10-02 DIAGNOSIS — G4733 Obstructive sleep apnea (adult) (pediatric): Secondary | ICD-10-CM | POA: Diagnosis not present

## 2021-10-02 NOTE — Patient Instructions (Signed)
Obstructive sleep apnea which appears well treated with BiPAP 15/11 and recent study  -Prescription already sent to medical supply company -They should call you with a delivery timeline  We will see you in about 3 to 4 months  We usually want to see you about a month into starting the treatment  Call us if pressure is not tolerated and we can make changes as needed  Encourage regular exercises

## 2021-10-02 NOTE — Progress Notes (Signed)
Antonio Curtis    [id number]    1941-03-05  Primary Care Physician:Gates, Molly Maduro, MD  Referring Physician: Marden Noble, MD [address] [place],  [place] [address]  Chief complaint:   Patient with history of obstructive sleep apnea   HPI:  History of obstructive sleep apnea diagnosed in May 2021 -AHI of 17.5, mild oxygen desaturations  -He recently had a titration study showing he was titrated to BiPAP 15/11 which was well-tolerated in the lab -He had used CPAP for about 2 to 3 months previously -BiPAP was discussed as an option of treatment as he was not tolerating therapy well  Has had issues with atrial fibrillation Recently had repeat ablation  -Concerned with untreated sleep disordered breathing with ongoing atrial fibrillation  He slept well during the night of the study -Felt he tolerated the pressures well  History of snoring, no witnessed apneas Admits to daytime sleepiness Occasional dryness of his mouth in the mornings No morning headaches No night sweats Usually goes to bed about 11 PM, falls asleep easily, multiple awakenings, final wake up time about 6 AM Sleep is nonrestorative  No family history of sleep apnea  He is active   Outpatient Encounter Medications as of 10/02/2021  Medication Sig   diphenhydrAMINE (BENADRYL) 25 MG tablet Take 25 mg by mouth at bedtime as needed for sleep.   ELIQUIS 5 MG TABS tablet TAKE 1 TABLET BY MOUTH EACH MORNING AND EVENING AS DIRECTED (NEEDS TO SCHEDULE F/U APPT)   levothyroxine (SYNTHROID) 88 MCG tablet Take 88 mcg by mouth daily before breakfast.   Metoprolol Tartrate 37.5 MG TABS TAKE 1 TABALET BY MOUTH 2 (TWO) TIMES DAILY.   No facility-administered encounter medications on file as of 10/02/2021.    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     PACEMAKER IMPLANT     Biotronik PPM implanted in [place] by Dr [name].   TONSILLECTOMY      Family History  Problem Relation Age of Onset   Heart disease Father 35   Diabetes Father     Social History   Socioeconomic History   Marital status: Married    Spouse name: Not on file   Number of children: Not on file   Years of education: Not on file   Highest education level: Not on file  Occupational History   Not on file  Tobacco Use   Smoking status: Never   Smokeless tobacco: Never  Vaping Use   Vaping Use: Never used  Substance and Sexual Activity   Alcohol use: Yes    Alcohol/week: 1.0 - 2.0 standard drink  Types: 1 - 2 Glasses of wine per week    Comment: monthly   Drug use: Never   Sexual activity: Not on file  Other Topics Concern   Not on file  Social History Narrative   Five children.  7 Grand.  Retired Education officer, community.   Lives in [place] with spouse.   Social Determinants of Health   Financial Resource Strain: Not on file  Food Insecurity: Not on file  Transportation Needs: Not on file  Physical Activity: Not on file  Stress: Not on file  Social Connections: Not on file  Intimate Partner Violence: Not on file    Review of Systems  Constitutional:  Negative for fatigue.  Respiratory:  Negative for shortness of breath.   Cardiovascular:  Negative for chest  pain.  Psychiatric/Behavioral:  Positive for sleep disturbance.    Vitals:   10/02/21 1041  BP: 118/64  Pulse: 68  Temp: 98.4 F (36.9 C)  SpO2: 98%     Physical Exam Constitutional:      Appearance: Normal appearance.  HENT:     Head: Normocephalic.     Nose: No congestion.  Cardiovascular:     Rate and Rhythm: Normal rate and regular rhythm.     Heart sounds: No murmur heard.   No friction rub.  Pulmonary:     Effort: No respiratory distress.     Breath sounds: No stridor. No wheezing or rhonchi.  Musculoskeletal:     Cervical back: No rigidity or tenderness.  Neurological:     Mental Status: He is alert.  Psychiatric:        Mood and Affect: Mood normal.   Results of the Epworth flowsheet 08/14/2021  Sitting and reading 3  Watching TV 2  Sitting, inactive in a public place (e.g. a theatre or a meeting) 0  As a passenger in a car for an hour without a break 3  Lying down to rest in the afternoon when circumstances permit 3  Sitting and talking to someone 0  Sitting quietly after a lunch without alcohol 2  In a car, while stopped for a few minutes in traffic 0  Total score 13     Data Reviewed: Sleep study from April 2021 shows moderate obstructive sleep apnea with mild oxygen desaturations  Recently had a titration study titrated to BiPAP 15/11  Assessment:  Moderate obstructive sleep apnea -Titrated to BiPAP 15/11 -Yet to start BiPAP therapy  Atrial fibrillation -Recent ablation  Past COVID infection will complete recovery  Pathophysiology of sleep disordered breathing discussed with the patient  Plan/Recommendations:  Patient has moderate obstructive sleep apnea, atrial fibrillation that is now controlled at present with recent repeat ablation-still  following up for A. Fib  Waiting to start treatment with BiPAP  I will see him about a month after starting BiPAP  Encouraged to give [place] a call if pressures are not well tolerated  Follow-up in  3 to 4 months  Encouraged to call with any significant concerns  Virl Diamond MD [facility] 10/02/2021, 11:10 AM  CC: Marden Noble, MD

## 2021-10-03 ENCOUNTER — Ambulatory Visit (INDEPENDENT_AMBULATORY_CARE_PROVIDER_SITE_OTHER): Payer: Medicare Other

## 2021-10-03 DIAGNOSIS — I495 Sick sinus syndrome: Secondary | ICD-10-CM

## 2021-10-03 LAB — CUP PACEART REMOTE DEVICE CHECK
Date Time Interrogation Session: 20230131082007
Implantable Lead Implant Date: 20181212
Implantable Lead Implant Date: 20181212
Implantable Lead Location: 753859
Implantable Lead Location: 753860
Implantable Lead Model: 377
Implantable Lead Model: 377
Implantable Lead Serial Number: 80523187
Implantable Lead Serial Number: 80573594
Implantable Pulse Generator Implant Date: 20181212
Pulse Gen Model: 407145
Pulse Gen Serial Number: 69192108

## 2021-10-11 NOTE — Progress Notes (Signed)
Remote pacemaker transmission.   

## 2021-10-14 ENCOUNTER — Other Ambulatory Visit: Payer: Self-pay | Admitting: Internal Medicine

## 2021-10-14 DIAGNOSIS — I480 Paroxysmal atrial fibrillation: Secondary | ICD-10-CM

## 2021-10-16 NOTE — Telephone Encounter (Signed)
Prescription refill request for Eliquis received.  Indication: afib  Last office visit: 08/09/2021, [place], 05/18/2021 [place]  Scr: 1.22, 06/06/2021 Age: 81 yo  Weight: 80.7 kg   Refill sent.

## 2021-10-20 ENCOUNTER — Telehealth: Payer: Self-pay | Admitting: Pharmacist

## 2021-10-20 ENCOUNTER — Ambulatory Visit: Payer: Medicare Other | Admitting: Internal Medicine

## 2021-10-20 ENCOUNTER — Other Ambulatory Visit: Payer: Self-pay

## 2021-10-20 ENCOUNTER — Encounter: Payer: Self-pay | Admitting: Internal Medicine

## 2021-10-20 VITALS — BP 104/58 | HR 84 | Ht 72.0 in | Wt 176.4 lb

## 2021-10-20 DIAGNOSIS — Z950 Presence of cardiac pacemaker: Secondary | ICD-10-CM | POA: Diagnosis not present

## 2021-10-20 DIAGNOSIS — I495 Sick sinus syndrome: Secondary | ICD-10-CM | POA: Diagnosis not present

## 2021-10-20 DIAGNOSIS — I4811 Longstanding persistent atrial fibrillation: Secondary | ICD-10-CM

## 2021-10-20 NOTE — Patient Instructions (Addendum)
Medication Instructions:  Your physician recommends that you continue on your current medications as directed. Please refer to the Current Medication list given to you today.  Labwork: None ordered.  Testing/Procedures: None ordered.  Follow-Up: Ventura Sellers with the afib clinic will contact you to schedule your dofetilide (Tikosyn) admission  Remote monitoring is used to monitor your Pacemaker from home. This monitoring reduces the number of office visits required to check your device to one time per year. It allows [place] to keep an eye on the functioning of your device to ensure it is working properly. You are scheduled for a device check from home on 01/02/2022. You may send your transmission at any time that day. If you have a wireless device, the transmission will be sent automatically. After your physician reviews your transmission, you will receive a postcard with your next transmission date.  Any Other Special Instructions Will Be Listed Below (If Applicable).  If you need a refill on your cardiac medications before your next appointment, please call your pharmacy.   Tikosyn (Dofetilide) Hospital Admission  Prior to day of admission: Check with drug insurance company for cost of drug to ensure affordability --- Dofetilide 500 mcg twice a day.  GoodRx is an option if insurance copay is unaffordable.  All patients are tested for COVID-19 prior to admission.  No Benadryl is allowed 3 days prior to admission.  Please ensure no missed doses of your anticoagulation (blood thinner) for 3 weeks prior to admission. If a dose is missed please notify our office immediately.  A pharmacist will review all your medications for potential interactions with Tikosyn. If any medication changes are needed prior to admission we will be in touch with you.  If any new medications are started AFTER your admission date is set with Radio producer. Please notify our office immediately so your medication list can  be updated and reviewed by our pharmacist again. On day of admission: Tikosyn initiation requires a 3 night/4 day hospital stay with constant telemetry monitoring. You will have an EKG after each dose of Tikosyn as well as daily lab draws.  If the drug does not convert you to normal rhythm a cardioversion after the 4th dose of Tikosyn.  Afib Clinic office visit on the morning of admission is needed for preliminary labs/ekg.  Time of admission is dependent on bed availability in the hospital. In some instances, you will be sent home until bed is available. Rarely admission can be delayed to the following day if hospital census prevents available beds.  You may bring personal belongings/clothing with you to the hospital. Please leave your suitcase in the car until you arrive in admissions.  Questions please call our office at [phone]    Dofetilide capsules What is this medication? DOFETILIDE (doe FET il ide) is an antiarrhythmic drug. It helps make your heart beat regularly. This medicine also helps to slow rapid heartbeats. This medicine may be used for other purposes; ask your health care provider or pharmacist if you have questions. COMMON BRAND NAME(S): Tikosyn What should I tell my care team before I take this medication? They need to know if you have any of these conditions: heart disease history of irregular heartbeat history of low levels of potassium or magnesium in the blood kidney disease liver disease an unusual or allergic reaction to dofetilide, other medicines, foods, dyes, or preservatives pregnant or trying to get pregnant breast-feeding How should I use this medication? Take this medicine by mouth with a glass  of water. Follow the directions on the prescription label. Do not take with grapefruit juice. You can take it with or without food. If it upsets your stomach, take it with food. Take your medicine at regular intervals. Do not take it more often than directed.  Do not stop taking except on your doctor's advice. A special MedGuide will be given to you by the pharmacist with each prescription and refill. Be sure to read this information carefully each time. Talk to your pediatrician regarding the use of this medicine in children. Special care may be needed. Overdosage: If you think you have taken too much of this medicine contact a poison control center or emergency room at once. NOTE: This medicine is only for you. Do not share this medicine with others. What if I miss a dose? If you miss a dose, skip it. Take your next dose at the normal time. Do not take extra or 2 doses at the same time to make up for the missed dose. What may interact with this medication? Do not take this medicine with any of the following medications: cimetidine cisapride dolutegravir dronedarone erdafitinib hydrochlorothiazide ketoconazole megestrol pimozide prochlorperazine thioridazine trimethoprim verapamil This medicine may also interact with the following medications: amiloride cannabinoids certain antibiotics like erythromycin or clarithromycin certain antiviral medicines for HIV or hepatitis certain medicines for depression, anxiety, or psychotic disorders digoxin diltiazem grapefruit juice metformin nefazodone other medicines that prolong the QT interval (an abnormal heart rhythm) quinine triamterene zafirlukast ziprasidone This list may not describe all possible interactions. Give your health care provider a list of all the medicines, herbs, non-prescription drugs, or dietary supplements you use. Also tell them if you smoke, drink alcohol, or use illegal drugs. Some items may interact with your medicine. What should I watch for while using this medication? Your condition will be monitored carefully while you are receiving this medicine. What side effects may I notice from receiving this medication? Side effects that you should report to your doctor  or health care professional as soon as possible: allergic reactions like skin rash, itching or hives, swelling of the face, lips, or tongue breathing problems chest pain or chest tightness dizziness signs and symptoms of a dangerous change in heartbeat or heart rhythm like chest pain; dizziness; fast or irregular heartbeat; palpitations; feeling faint or lightheaded, falls; breathing problems signs and symptoms of electrolyte imbalance like severe diarrhea, unusual sweating, vomiting, loss of appetite, increased thirst swelling of the ankles, legs, or feet tingling, numbness in the hands or feet Side effects that usually do not require medical attention (report to your doctor or health care professional if they continue or are bothersome): diarrhea general ill feeling or flu-like symptoms headache nausea trouble sleeping stomach pain This list may not describe all possible side effects. Call your doctor for medical advice about side effects. You may report side effects to FDA at 1-800-FDA-1088. Where should I keep my medication? Keep out of the reach of children. Store at room temperature between 15 and 30 degrees C (59 and 86 degrees F). Throw away any unused medicine after the expiration date. NOTE: This sheet is a summary. It may not cover all possible information. If you have questions about this medicine, talk to your doctor, pharmacist, or health care provider.  2022 Elsevier/Gold Standard (2021-05-09 00:00:00)

## 2021-10-20 NOTE — Progress Notes (Signed)
° ° °  PCP: Marden Noble, MD Primary Cardiologist: Dr Antoine Poche Primary EP:  Dr Graciela Husbands  Antonio Curtis is a 81 y.o. male who presents today for routine electrophysiology followup.  Since last being seen in our clinic, the patient reports doing very well.  Today, he denies symptoms of palpitations, chest pain, shortness of breath,  lower extremity edema, dizziness, presyncope, or syncope.  The patient is otherwise without complaint today.   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     PACEMAKER IMPLANT     Biotronik PPM implanted in [place] by Dr [name].   TONSILLECTOMY      ROS- all systems are reviewed and negative except as per HPI above  Current Outpatient Medications  Medication Sig Dispense Refill   apixaban (ELIQUIS) 5 MG TABS tablet Take 1 tablet (5 mg total) by mouth 2 (two) times daily. 180 tablet 1   diphenhydrAMINE (BENADRYL) 25 MG tablet Take 25 mg by mouth at bedtime as needed for sleep.     levothyroxine (SYNTHROID) 88 MCG tablet Take 88 mcg by mouth daily before breakfast.     Metoprolol Tartrate 37.5 MG TABS TAKE 1 TABALET BY MOUTH 2 (TWO) TIMES DAILY. 180 tablet 3   No current facility-administered medications for this visit.    Physical Exam: Vitals:   10/20/21 0913  BP: (!) 104/58  Pulse:  84  SpO2: 97%  Weight: 176 lb 6.4 oz (80 kg)  Height: 6' (1.829 m)    GEN- The patient is well appearing, alert and oriented x 3 today.   Head- normocephalic, atraumatic Eyes-  Sclera clear, conjunctiva pink Ears- hearing intact Oropharynx- clear Lungs- Clear to ausculation bilaterally, normal work of breathing Chest- pacemaker pocket is well healed Heart- iRRR GI- soft, NT, ND, + BS Extremities- no clubbing, cyanosis, or edema  Pacemaker interrogation- reviewed in detail today,  See PACEART report  ekg tracing ordered today is personally reviewed and shows afib  Assessment and Plan:  1. Symptomatic sinus bradycardia  Normal pacemaker function See Pace Art report No changes today he is not device dependant today  2. Persistent afib Burden 30% post ablation Chads2vasc score is at least 2.  Continue eliquis I have advised AAD therapy at this time.  V rates are controlled.  I dont not think that AV nodal ablation would be beneficial.  We discussed tikosyn, amiodarone, and MAZE as options.  He would like to start tikosyn which I think is his best option.  We will schedule admission over the next few days.  3. OSA Compliant with therapy advised  Risks, benefits and potential toxicities for medications prescribed and/or refilled reviewed with patient today.   [name] MD, [facility] 10/20/2021 9:23 AM

## 2021-10-20 NOTE — Telephone Encounter (Signed)
Medication list reviewed in anticipation of upcoming Tikosyn initiation. Patient is taking prn Benadryl which he will need to discontinue.  Patient is anticoagulated on Eliquis 5mg  BID on the appropriate dose. Please ensure that patient has not missed any anticoagulation doses in the 3 weeks prior to Tikosyn initiation.

## 2021-10-26 NOTE — Telephone Encounter (Signed)
Pt notified no benadryl.

## 2021-11-17 ENCOUNTER — Other Ambulatory Visit: Payer: Self-pay | Admitting: Internal Medicine

## 2021-11-17 LAB — SARS CORONAVIRUS 2 (TAT 6-24 HRS): SARS Coronavirus 2: NEGATIVE

## 2021-11-21 ENCOUNTER — Other Ambulatory Visit (HOSPITAL_COMMUNITY): Payer: Self-pay

## 2021-11-21 ENCOUNTER — Ambulatory Visit (HOSPITAL_COMMUNITY)
Admission: RE | Admit: 2021-11-21 | Discharge: 2021-11-21 | Disposition: A | Discharge status: Home / Self Care | Payer: Medicare Other | Source: Ambulatory Visit | Attending: Physician Assistant | Admitting: Physician Assistant

## 2021-11-21 ENCOUNTER — Inpatient Hospital Stay (HOSPITAL_COMMUNITY)
Admission: RE | Admit: 2021-11-21 | Discharge: 2021-11-24 | DRG: 309 | Disposition: A | Discharge status: Home / Self Care | Payer: Medicare Other | Source: Ambulatory Visit | Attending: Cardiology | Admitting: Cardiology

## 2021-11-21 ENCOUNTER — Other Ambulatory Visit: Payer: Self-pay

## 2021-11-21 ENCOUNTER — Encounter (HOSPITAL_COMMUNITY): Payer: Self-pay | Admitting: Cardiology

## 2021-11-21 ENCOUNTER — Encounter (HOSPITAL_COMMUNITY): Payer: Self-pay | Admitting: Physician Assistant

## 2021-11-21 VITALS — BP 104/72 | HR 73 | Ht 72.0 in | Wt 178.8 lb

## 2021-11-21 DIAGNOSIS — I4819 Other persistent atrial fibrillation: Principal | ICD-10-CM

## 2021-11-21 DIAGNOSIS — I484 Atypical atrial flutter: Secondary | ICD-10-CM

## 2021-11-21 DIAGNOSIS — D6869 Other thrombophilia: Secondary | ICD-10-CM

## 2021-11-21 DIAGNOSIS — I4892 Unspecified atrial flutter: Secondary | ICD-10-CM | POA: Diagnosis present

## 2021-11-21 DIAGNOSIS — Z7901 Long term (current) use of anticoagulants: Secondary | ICD-10-CM | POA: Diagnosis not present

## 2021-11-21 DIAGNOSIS — I495 Sick sinus syndrome: Secondary | ICD-10-CM | POA: Diagnosis present

## 2021-11-21 DIAGNOSIS — Z833 Family history of diabetes mellitus: Secondary | ICD-10-CM | POA: Diagnosis not present

## 2021-11-21 DIAGNOSIS — Z8249 Family history of ischemic heart disease and other diseases of the circulatory system: Secondary | ICD-10-CM | POA: Diagnosis not present

## 2021-11-21 DIAGNOSIS — E039 Hypothyroidism, unspecified: Secondary | ICD-10-CM | POA: Diagnosis present

## 2021-11-21 DIAGNOSIS — Z950 Presence of cardiac pacemaker: Secondary | ICD-10-CM | POA: Diagnosis not present

## 2021-11-21 DIAGNOSIS — G4733 Obstructive sleep apnea (adult) (pediatric): Secondary | ICD-10-CM | POA: Diagnosis present

## 2021-11-21 LAB — BASIC METABOLIC PANEL
Anion gap: 8 (ref 5–15)
BUN: 14 mg/dL (ref 8–23)
CO2: 25 mmol/L (ref 22–32)
Calcium: 9.2 mg/dL (ref 8.9–10.3)
Chloride: 102 mmol/L (ref 98–111)
Creatinine, Ser: 1.27 mg/dL — ABNORMAL HIGH (ref 0.61–1.24)
GFR, Estimated: 58 mL/min — ABNORMAL LOW (ref >60)
Glucose, Bld: 107 mg/dL — ABNORMAL HIGH (ref 70–99)
Potassium: 4.8 mmol/L (ref 3.5–5.1)
Sodium: 135 mmol/L (ref 135–145)

## 2021-11-21 LAB — MAGNESIUM: Magnesium: 2.3 mg/dL (ref 1.7–2.4)

## 2021-11-21 MED ORDER — SODIUM CHLORIDE 0.9 % IV SOLN: 250.0000 mL | INTRAVENOUS | Status: DC | PRN

## 2021-11-21 MED ORDER — LEVOTHYROXINE SODIUM 88 MCG PO TABS
88.0000 ug | ORAL_TABLET | Freq: Every day | ORAL | Status: DC
  Administered 2021-11-22 – 2021-11-24 (×3): 88 ug via ORAL
  Filled 2021-11-21 (×3): qty 1

## 2021-11-21 MED ORDER — METOPROLOL TARTRATE 12.5 MG HALF TABLET
37.5000 mg | ORAL_TABLET | Freq: Two times a day (BID) | ORAL | Status: DC
  Administered 2021-11-21 – 2021-11-24 (×6): 37.5 mg via ORAL
  Filled 2021-11-21 (×6): qty 1

## 2021-11-21 MED ORDER — APIXABAN 5 MG PO TABS
5.0000 mg | ORAL_TABLET | Freq: Two times a day (BID) | ORAL | Status: DC
  Administered 2021-11-21 – 2021-11-24 (×6): 5 mg via ORAL
  Filled 2021-11-21 (×6): qty 1

## 2021-11-21 MED ORDER — DOFETILIDE 250 MCG PO CAPS
250.0000 ug | ORAL_CAPSULE | Freq: Two times a day (BID) | ORAL | Status: DC
  Administered 2021-11-21 – 2021-11-24 (×6): 250 ug via ORAL
  Filled 2021-11-21 (×6): qty 1

## 2021-11-21 MED ORDER — SODIUM CHLORIDE 0.9% FLUSH: 3.0000 mL | INTRAVENOUS | Status: DC | PRN

## 2021-11-21 MED ORDER — SODIUM CHLORIDE 0.9% FLUSH
3.0000 mL | Freq: Two times a day (BID) | INTRAVENOUS | Status: DC
  Administered 2021-11-22 – 2021-11-24 (×3): 3 mL via INTRAVENOUS

## 2021-11-21 MED ORDER — METOPROLOL TARTRATE 37.5 MG PO TABS: 37.5000 mg | ORAL_TABLET | Freq: Two times a day (BID) | ORAL | Status: DC

## 2021-11-21 NOTE — Progress Notes (Signed)
Pharmacy: Dofetilide (Tikosyn) - Initial Consult ?Assessment and Electrolyte Replacement ? ?Pharmacy consulted to assist in monitoring and replacing electrolytes in this 81 y.o. male admitted on 11/21/2021 undergoing dofetilide initiation. First dofetilide dose: 11/21/21.  ? ?Assessment: ? ?Patient Exclusion Criteria: If any screening criteria checked as "Yes", then  patient  should NOT receive dofetilide until criteria item is corrected.  ?If ?Yes? please indicate correction plan. ? ?YES  NO Patient  Exclusion Criteria Correction Plan  ? ?[]   ?[x]   ?Baseline QTc interval is greater than or equal to 440 msec. ?IF above YES box checked dofetilide contraindicated unless patient has ICD; then may proceed if QTc 500-550 msec or with known ventricular conduction abnormalities may proceed with QTc 550-600 msec. ?QTc = 43ms   ? ?[]   ?[x]   ?Patient is known or suspected to have a digoxin level greater than 2 ng/ml: ?No results found for: DIGOXIN ?   ? ?[]   ?[x]   ?Creatinine clearance less than 20 ml/min (calculated using Cockcroft-Gault, actual body weight and serum creatinine): ?Estimated Creatinine Clearance: 52.6 mL/min (A) (by C-G formula based on SCr of 1.27 mg/dL (H)). ?   ? ?[]   ?[x]  Patient has received drugs known to prolong the QT intervals within the last 48 hours (phenothiazines, tricyclics or tetracyclic antidepressants, erythromycin, H-1 antihistamines, cisapride, fluoroquinolones, azithromycin, ondansetron).  ? ?Updated information on QT prolonging agents is available to be searched on the following database:QT prolonging agents    ? ?[]   ?[x]   ?Patient received a dose of hydrochlorothiazide (Oretic) alone or in any combination including triamterene (Dyazide, Maxzide) in the last 48 hours.   ? ?[]   ?[x]  Patient received a medication known to increase dofetilide plasma concentrations prior to initial dofetilide dose:  ?Trimethoprim (Primsol, Proloprim) in the last 36 hours ?Verapamil (Calan, Verelan) in the  last 36 hours or a sustained release dose in the last 72 hours ?Megestrol (Megace) in the last 5 days  ?Cimetidine (Tagamet) in the last 6 hours ?Ketoconazole (Nizoral) in the last 24 hours ?Itraconazole (Sporanox) in the last 48 hours  ?Prochlorperazine (Compazine) in the last 36 hours ?   ? ?[]   ?[x]   ?Patient is known to have a history of torsades de pointes; congenital or acquired long QT syndromes.   ? ?[]   ?[x]   ?Patient has received a Class 1 antiarrhythmic with less than 2 half-lives since last dose. ?(Disopyramide, Quinidine, Procainamide, Lidocaine, Mexiletine, Flecainide, Propafenone)   ? ?[]   ?[x]   ?Patient has received amiodarone therapy in the past 3 months or amiodarone level is greater than 0.3 ng/ml.   ? ?Patient has been appropriately anticoagulated with apixaban. ? ?Labs: ?   ?Component Value Date/Time  ? K 4.8 11/21/2021 1130  ? MG 2.3 11/21/2021 1130  ?  ? ?Plan: ?Potassium: ?K >/= 4: Appropriate to initiate Tikosyn, no replacement needed   ? ?Magnesium: ?Mg >2: Appropriate to initiate Tikosyn, no replacement needed   ? ?Benefits check in process.  ? ?Thank you for allowing pharmacy to participate in this patient's care  ? ?Erin Hearing PharmD., BCPS ?Clinical Pharmacist ?11/21/2021 3:05 PM ? ?

## 2021-11-21 NOTE — TOC Benefit Eligibility Note (Signed)
Patient Advocate Encounter ? ?Insurance verification completed.   ? ?The patient is currently admitted and upon discharge could be taking dofetilide (Tikosyn) 250 mcg. ? ?The current 30 day co-pay is, $14.40.  ? ?The patient is insured through [employer] Part D  ? ? ? ?Roland Earl, CPhT ?Pharmacy Patient Advocate Specialist ?[facility] Pharm[facility] Patient Advocate Team ?Direct Number: [phone]  Fax: [phone] ? ? ? ? ? ?  ?

## 2021-11-21 NOTE — Progress Notes (Addendum)
? ? ?Primary Care Physician: Josetta Huddle, MD ?Primary Cardiologist: Dr Percival Spanish  ?Primary Electrophysiologist: Dr Caryl Comes ?Referring Physician: Dr Rayann Heman ? ? ?Antonio Curtis is a 81 y.o. male with a history of SSS s/p PPM, hypothyroid, OSA, and paroxysmal atrial fibrillation who presents for follow up in the [facility]. He reports initially being diagnosed with atrial fibrillation in 2016 after presenting to the ER with numbness down his L side (face, arm and leg) numbness.  He was noted to have afib at the time.  He was also found to be dehydrated at that time. He was placed on flecainide and eliquis.  He did well initially.  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  He had Biotronik PPM implanted by Dr [facility] in [place].  He moved to [facility] 07/2019 and has been followed by Dr Caryl Comes since that time.  Unfortunately, he began having increasing frequency and duration of atrial fibrillation.  Flecainide was switched to rhythmol, with some improvement in his afib. Not felt to be a good candidate for sotalol or dofetilide with QT prolongation. He is now s/p afib ablation with Dr Rayann Heman on 05/20/20. Patient is on Eliquis for a CHADS2VASC score of 2.  ? ?Patient is s/p repeat afib ablation with Dr Rayann Heman on 06/29/21 but unfortunately continued to have a high burden of afib.  ? ?On follow up today, patient presents for dofetilide admission. He is in rate controlled atrial flutter today. He denies any missed doses of anticoagulation in the past 3 weeks.  ? ?Today, he denies symptoms of palpitations, chest pain, orthopnea, PND, lower extremity edema, dizziness, presyncope, syncope, bleeding, or neurologic sequela. The patient is tolerating medications without difficulties and is otherwise without complaint today.  ? ? ?Atrial Fibrillation Risk Factors: ? ?he does have symptoms or diagnosis of sleep apnea. ?he is compliant with  BiPAP therapy. ?he does not have a history of rheumatic fever. ? ? ?he has a BMI of Body mass index is 24.25 kg/m?Marland KitchenMarland Kitchen ?Filed Weights  ? 11/21/21 1125  ?Weight: 81.1 kg  ? ? ? ? ?Family History  ?Problem Relation Age of Onset  ? Heart disease Father 68  ? Diabetes Father   ? ? ? ?Atrial Fibrillation Management history: ? ?Previous antiarrhythmic drugs: flecainide, propafenone  ?Previous cardioversions: none ?Previous ablations: 05/20/20, 06/29/21 ?CHADS2VASC score: 2 ?Anticoagulation history: Eliquis ? ? ?Past Medical History:  ?Diagnosis Date  ? H/O partial resection of colon   ? Hypothyroid   ? Pacemaker   ? Biotronik Edora 8 DR-T  ? Paroxysmal atrial fibrillation (HCC)   ? Sick sinus syndrome ([place])   ? ?Past Surgical History:  ?Procedure Laterality Date  ? ACHILLES TENDON REPAIR    ? APPENDECTOMY    ? ATRIAL FIBRILLATION ABLATION N/A 05/20/2020  ? Procedure: ATRIAL FIBRILLATION ABLATION;  Surgeon: Thompson Grayer, MD;  Location: [facility];  Service: Cardiovascular;  Laterality: N/A;  ? ATRIAL FIBRILLATION ABLATION N/A 06/29/2021  ? Procedure: ATRIAL FIBRILLATION ABLATION;  Surgeon: Thompson Grayer, MD;  Location: [facility];  Service: Cardiovascular;  Laterality: N/A;  ? COLON SURGERY    ? Hemicolectomy:  Polyp.    ? PACEMAKER IMPLANT    ? Biotronik PPM implanted in [place] by Dr Joelene Millin.  ? TONSILLECTOMY    ? ? ?Current Outpatient Medications  ?Medication Sig Dispense Refill  ? apixaban (ELIQUIS) 5 MG TABS tablet Take 1 tablet (5 mg total)  by mouth 2 (two) times daily. 180 tablet 1  ? levothyroxine (SYNTHROID) 88 MCG tablet Take 88 mcg by mouth daily before breakfast.    ? Metoprolol Tartrate 37.5 MG TABS TAKE 1 TABALET BY MOUTH 2 (TWO) TIMES DAILY. 180 tablet 3  ? ?No current facility-administered medications for this encounter.  ? ? ?No Known Allergies ? ?Social History  ? ?Socioeconomic History  ? Marital status: Married  ?  Spouse name: Not on file  ? Number of children: Not on file  ? Years of  education: Not on file  ? Highest education level: Not on file  ?Occupational History  ? Not on file  ?Tobacco Use  ? Smoking status: Never  ?  Passive exposure: Past  ? Smokeless tobacco: Never  ? Tobacco comments:  ?  Never smoke 11/21/21  ?Vaping Use  ? Vaping Use: Never used  ?Substance and Sexual Activity  ? Alcohol use: Yes  ?  Alcohol/week: 1.0 - 2.0 standard drink  ?  Types: 1 - 2 Glasses of wine per week  ?  Comment: monthly  ? Drug use: Never  ? Sexual activity: Not on file  ?Other Topics Concern  ? Not on file  ?Social History Narrative  ? Five children.  [place]  Retired Pharmacist, community.  ? Lives in [place] with spouse.  ? ?Social Determinants of Health  ? ?Financial Resource Strain: Not on file  ?Food Insecurity: Not on file  ?Transportation Needs: Not on file  ?Physical Activity: Not on file  ?Stress: Not on file  ?Social Connections: Not on file  ?Intimate Partner Violence: Not on file  ? ? ? ?ROS- All systems are reviewed and negative except as per the HPI above. ? ?Physical Exam: ?Vitals:  ? 11/21/21 1125  ?BP: 104/72  ?Pulse: 73  ?Weight: 81.1 kg  ?Height: 6' (1.829 m)  ? ? ?GEN- The patient is a well appearing elderly male, alert and oriented x 3 today.   ?HEENT-head normocephalic, atraumatic, sclera clear, conjunctiva pink, hearing intact, trachea midline. ?Lungs- Clear to ausculation bilaterally, normal work of breathing ?Heart- irregular rate and rhythm, no murmurs, rubs or gallops  ?GI- soft, NT, ND, + BS ?Extremities- no clubbing, cyanosis, or edema ?MS- no significant deformity or atrophy ?Skin- no rash or lesion ?Psych- euthymic mood, full affect ?Neuro- strength and sensation are intact ? ? ?Wt Readings from Last 3 Encounters:  ?11/21/21 81.1 kg  ?10/20/21 80 kg  ?10/02/21 80.7 kg  ? ? ?EKG today demonstrates  ?Atrial flutter with variable block ?Vent. rate 73 BPM ?PR interval * ms ?QRS duration 96 ms ?QT/QTcB 404/445 ms ? ?Echo 04/20/20 demonstrated  ?1. Left ventricular ejection fraction,  by estimation, is 60 to 65%. The  ?left ventricle has normal function. The left ventricle has no regional  ?wall motion abnormalities. Left ventricular diastolic parameters are  ?indeterminate.  ? 2. Right ventricular systolic function is normal. The right ventricular  ?size is normal. There is normal pulmonary artery systolic pressure.  ? 3. Left atrial size was mildly dilated.  ? 4. The mitral valve is normal in structure. Trivial mitral valve  ?regurgitation. No evidence of mitral stenosis.  ? 5. The aortic valve is tricuspid. Aortic valve regurgitation is not  ?visualized. No aortic stenosis is present.  ? 6. Aortic dilatation noted. Aneurysm of the ascending aorta, measuring 37 mm. There is mild dilatation at the level of the sinuses of Valsalva  ?measuring 38 mm.  ? 7. The inferior  vena cava is normal in size with greater than 50%  ?respiratory variability, suggesting right atrial pressure of 3 mmHg.  ? ?Epic records are reviewed at length today ? ?CHA2DS2-VASc Score = 2  ?The patient's score is based upon: ?CHF History: 0 ?HTN History: 0 ?Diabetes History: 0 ?Stroke History: 0 ?Vascular Disease History: 0 ?Age Score: 2 ?Gender Score: 0 ?    ? ? ? ?ASSESSMENT AND PLAN: ?1. Paroxysmal Atrial Fibrillation/atrial flutter ?The patient's CHA2DS2-VASc score is 2, indicating a 2.2% annual risk of stroke.   ?S/p afib ablation with Dr Rayann Heman on 05/20/20 with repeat ablation 06/29/21. ?Patient presents for dofetilide admission.  ?Continue Eliquis 5 mg BID, states no missed doses in the last 3 weeks. ?No recent benadryl use ?PharmD has screened medications ?QTc in SR 444 ms ?Labs today show creatinine at 1.27, K+ 4.8 and mag 2.3, CrCl calculated at 55 mL/min ?Continue Eliquis 5 mg BID ?Continue Lopressor 37.5 mg BID ? ?2. Secondary Hypercoagulable State (ICD10:  D68.69) ?The patient is at significant risk for stroke/thromboembolism based upon his CHA2DS2-VASc Score of 2.  Continue Apixaban (Eliquis).  ? ?3. Obstructive  sleep apnea ?Patient started BiPap one week ago.  ?Followed by Dr Ander Slade ? ?4. SSS ?S/p PPM, followed by Dr Caryl Comes and the device clinic. ? ? ?To be admitted later today once a bed becomes available.  ? ? ?Ri

## 2021-11-21 NOTE — TOC Progression Note (Signed)
Transition of Care (TOC) - Progression Note  ? ? ?Patient Details  ?Name: Antonio Curtis ?MRN: [id number] ?Date of Birth: 12-Jul-1941 ? ?Transition of Care (TOC) CM/SW Contact  ?Zenon Mayo, RN ?Phone Number: ?11/21/2021, 4:23 PM ? ?Clinical Narrative:    ?From home, s/p repeat afib ablation with Dr Rayann Heman on 06/29/21 but unfortunately continued to have a high burden of afib.  ?patient presents for tikosyn initiation. Benefit check done.  TOC will continue to follow for dc needs.  ? ? ?  ?  ? ?Expected Discharge Plan and Services ?  ?  ?  ?  ?  ?                ?  ?  ?  ?  ?  ?  ?  ?  ?  ?  ? ? ?Social Determinants of Health (SDOH) Interventions ?  ? ?Readmission Risk Interventions ?No flowsheet data found. ? ?

## 2021-11-21 NOTE — H&P (Signed)
?Electrophysiology H&P  Note  ? ? ?Primary Care Physician: Marden Noble, MD ?Primary Cardiologist: Dr Antoine Poche  ?Primary Electrophysiologist: Dr Graciela Husbands ?Referring Physician: Dr Johney Frame ? ? ?Antonio Curtis is a 81 y.o. male with a history of SSS s/p PPM, hypothyroid, OSA, and paroxysmal atrial fibrillation who presents for follow up in the [facility]. He reports initially being diagnosed with atrial fibrillation in 2016 after presenting to the ER with numbness down his L side (face, arm and leg) numbness.  He was noted to have afib at the time.  He was also found to be dehydrated at that time. He was placed on flecainide and eliquis.  He did well initially.  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  He had Biotronik PPM implanted by [facility] in [place].  He moved to [facility] 07/2019 and has been followed by Dr Graciela Husbands since that time.  Unfortunately, he began having increasing frequency and duration of atrial fibrillation.  Flecainide was switched to rhythmol, with some improvement in his afib. Not felt to be a good candidate for sotalol or dofetilide with QT prolongation. He is now s/p afib ablation with Dr Johney Frame on 05/20/20. Patient is on Eliquis for a CHADS2VASC score of 2.  ? ?Patient is s/p repeat afib ablation with Dr Johney Frame on 06/29/21 but unfortunately continued to have a high burden of afib.  ? ?On follow up today, patient presents for dofetilide admission. He is in rate controlled atrial flutter today. He denies any missed doses of anticoagulation in the past 3 weeks.  ? ?Today, he denies symptoms of palpitations, chest pain, orthopnea, PND, lower extremity edema, dizziness, presyncope, syncope, bleeding, or neurologic sequela. The patient is tolerating medications without difficulties and is otherwise without complaint today.  ? ? ?Atrial Fibrillation Risk Factors: ? ?he does have symptoms or diagnosis of sleep  apnea. ?he is compliant with BiPAP therapy. ?he does not have a history of rheumatic fever. ? ? ?he has a BMI of There is no height or weight on file to calculate BMI.Marland Kitchen ?There were no vitals filed for this visit. ? ? ? ? ?Family History  ?Problem Relation Age of Onset  ? Heart disease Father 28  ? Diabetes Father   ? ? ? ?Atrial Fibrillation Management history: ? ?Previous antiarrhythmic drugs: flecainide, propafenone  ?Previous cardioversions: none ?Previous ablations: 05/20/20, 06/29/21 ?CHADS2VASC score: 2 ?Anticoagulation history: Eliquis ? ? ?Past Medical History:  ?Diagnosis Date  ? H/O partial resection of colon   ? Hypothyroid   ? Pacemaker   ? Biotronik Edora 8 DR-T  ? Paroxysmal atrial fibrillation (HCC)   ? Sick sinus syndrome (HCC)   ? ?Past Surgical History:  ?Procedure Laterality Date  ? ACHILLES TENDON REPAIR    ? APPENDECTOMY    ? ATRIAL FIBRILLATION ABLATION N/A 05/20/2020  ? Procedure: ATRIAL FIBRILLATION ABLATION;  Surgeon: [name], MD;  Location: [facility];  Service: Cardiovascular;  Laterality: N/A;  ? ATRIAL FIBRILLATION ABLATION N/A 06/29/2021  ? Procedure: ATRIAL FIBRILLATION ABLATION;  Surgeon: [name], MD;  Location: [facility];  Service: Cardiovascular;  Laterality: N/A;  ? COLON SURGERY    ? Hemicolectomy:  Polyp.    ? PACEMAKER IMPLANT    ? Biotronik PPM implanted in [place] by Dr [name].  ? TONSILLECTOMY    ? ? ?No current facility-administered medications for this encounter.  ? ? ?No Known Allergies ? ?Social  History  ? ?Socioeconomic History  ? Marital status: Married  ?  Spouse name: Not on file  ? Number of children: Not on file  ? Years of education: Not on file  ? Highest education level: Not on file  ?Occupational History  ? Not on file  ?Tobacco Use  ? Smoking status: Never  ?  Passive exposure: Past  ? Smokeless tobacco: Never  ? Tobacco comments:  ?  Never smoke 11/21/21  ?Vaping Use  ? Vaping Use: Never used  ?Substance and Sexual Activity  ?  Alcohol use: Yes  ?  Alcohol/week: 1.0 - 2.0 standard drink  ?  Types: 1 - 2 Glasses of wine per week  ?  Comment: monthly  ? Drug use: Never  ? Sexual activity: Not on file  ?Other Topics Concern  ? Not on file  ?Social History Narrative  ? Five children.  7 Grand.  Retired Education officer, communitydentist.  ? [place].  ? ?Social Determinants of Health  ? ?Financial Resource Strain: Not on file  ?Food Insecurity: Not on file  ?Transportation Needs: Not on file  ?Physical Activity: Not on file  ?Stress: Not on file  ?Social Connections: Not on file  ?Intimate Partner Violence: Not on file  ? ? ? ?ROS- All systems are reviewed and negative except as per the HPI above. ? ?Physical Exam: ?   ?Vitals:  ?  11/21/21 1125  ?BP: 104/72  ?Pulse: 73  ?Weight: 81.1 kg  ?Height: 6' (1.829 m)  ? ? ? ?GEN- The patient is a well appearing elderly male, alert and oriented x 3 today.   ?HEENT-head normocephalic, atraumatic, sclera clear, conjunctiva pink, hearing intact, trachea midline. ?Lungs- Clear to ausculation bilaterally, normal work of breathing ?Heart- irregular rate and rhythm, no murmurs, rubs or gallops  ?GI- soft, NT, ND, + BS ?Extremities- no clubbing, cyanosis, or edema ?MS- no significant deformity or atrophy ?Skin- no rash or lesion ?Psych- euthymic mood, full affect ?Neuro- strength and sensation are intact ? ? ?Wt Readings from Last 3 Encounters:  ?11/21/21 81.1 kg  ?10/20/21 80 kg  ?10/02/21 80.7 kg  ? ? ?EKG today demonstrates  ?Atrial flutter with variable block ?Vent. rate 73 BPM ?PR interval * ms ?QRS duration 96 ms ?QT/QTcB 404/445 ms ? ?Echo 04/20/20 demonstrated  ?1. Left ventricular ejection fraction, by estimation, is 60 to 65%. The  ?left ventricle has normal function. The left ventricle has no regional  ?wall motion abnormalities. Left ventricular diastolic parameters are  ?indeterminate.  ? 2. Right ventricular systolic function is normal. The right ventricular  ?size is normal. There is normal  pulmonary artery systolic pressure.  ? 3. Left atrial size was mildly dilated.  ? 4. The mitral valve is normal in structure. Trivial mitral valve  ?regurgitation. No evidence of mitral stenosis.  ? 5. The aortic valve is tricuspid. Aortic valve regurgitation is not  ?visualized. No aortic stenosis is present.  ? 6. Aortic dilatation noted. Aneurysm of the ascending aorta, measuring 37 mm. There is mild dilatation at the level of the sinuses of Valsalva  ?measuring 38 mm.  ? 7. The inferior vena cava is normal in size with greater than 50%  ?respiratory variability, suggesting right atrial pressure of 3 mmHg.  ? ?Epic records are reviewed at length today ? ?CHA2DS2-VASc Score = 2  ?The patient's score is based upon: ?CHF History: 0 ?HTN History: 0 ?Diabetes History: 0 ?Stroke History: 0 ?Vascular Disease History: 0 ?  Age Score: 2 ?Gender Score: 0 ?    ? ? ?ASSESSMENT AND PLAN: ?1. Paroxysmal Atrial Fibrillation/atrial flutter ?The patient's CHA2DS2-VASc score is 2, indicating a 2.2% annual risk of stroke.   ?S/p afib ablation with Dr Johney Frame on 05/20/20 with repeat ablation 06/29/21. ?Patient presents for dofetilide admission.  ?Continue Eliquis 5 mg BID, states no missed doses in the last 3 weeks. ?No recent benadryl use ?PharmD has screened medications ?QTc in SR 444 ms ?Labs today show creatinine at 1.27, K+ 4.8 and mag 2.3, CrCl calculated at 55 mL/min ?Continue Eliquis 5 mg BID ?Continue Lopressor 37.5 mg BID ? ?2. Secondary Hypercoagulable State (ICD10:  D68.69) ?The patient is at significant risk for stroke/thromboembolism based upon his CHA2DS2-VASc Score of 2.  Continue Apixaban (Eliquis).  ? ?3. Obstructive sleep apnea ?Patient started BiPap one week ago.  ?Followed by Dr Wynona Neat ? ?4. SSS ?S/p PPM, followed by Dr Graciela Husbands and the device clinic. ? ?Pt presents for planned tikosyn admission.  ? ?Doreatha Martin, PA-C  ?11/21/2021 2:56 PM  ?

## 2021-11-22 LAB — BASIC METABOLIC PANEL
Anion gap: 7 (ref 5–15)
BUN: 15 mg/dL (ref 8–23)
CO2: 27 mmol/L (ref 22–32)
Calcium: 8.9 mg/dL (ref 8.9–10.3)
Chloride: 103 mmol/L (ref 98–111)
Creatinine, Ser: 1.2 mg/dL (ref 0.61–1.24)
GFR, Estimated: >60 mL/min (ref >60)
Glucose, Bld: 115 mg/dL — ABNORMAL HIGH (ref 70–99)
Potassium: 4.3 mmol/L (ref 3.5–5.1)
Sodium: 137 mmol/L (ref 135–145)

## 2021-11-22 LAB — MAGNESIUM: Magnesium: 2.3 mg/dL (ref 1.7–2.4)

## 2021-11-22 NOTE — Progress Notes (Signed)
Mobility Specialist Progress Note  ? ? 11/22/21 1642  ?Mobility  ?Activity Ambulated independently in hallway  ?Level of Assistance Independent  ?Assistive Device None  ?Distance Ambulated (ft) 800 ft  ?Activity Response Tolerated well  ?$Mobility charge 1 Mobility  ? ?Pre-Mobility: 82 HR ?During Mobility: 91 HR ?Post-Mobility: 76 HR ? ?Pt received in bed and agreeable. No complaints. Returned to bed with call bell in reach.  ? ?Hildred Alamin ?Mobility Specialist  ?  ?

## 2021-11-22 NOTE — Progress Notes (Signed)
Morning EKG reviewed   ? ?Shows remains in NSR at 71 bpm with stable QTc at ~470 ms. ? ?Continue  Tikosyn 250 mcg BID.  ? ?Plan for home Friday if QTc remains stable.    ? ?Graciella Freer, PA-C  ?Pager: [phone]  ?11/22/2021 11:23 AM  ? ?

## 2021-11-22 NOTE — Progress Notes (Signed)
? ?Electrophysiology Rounding Note ? ?Patient Name: Antonio Curtis ?Date of Encounter: 11/22/2021 ? ?Primary Cardiologist: Minus Breeding, MD  ?Electrophysiologist: None  ? ? ?Subjective  ? ?Pt  remains in NSR  on Tikosyn 250 mcg BID  ? ?QTc from EKG last pm shows stable QTc at ~470 when measured manually ? ?The patient is doing well today.  At this time, the patient denies chest pain, shortness of breath, or any new concerns. ? ?Inpatient Medications  ?  ?Scheduled Meds: ? apixaban  5 mg Oral BID  ? dofetilide  250 mcg Oral BID  ? levothyroxine  88 mcg Oral QAC breakfast  ? metoprolol tartrate  37.5 mg Oral BID  ? sodium chloride flush  3 mL Intravenous Q12H  ? ?Continuous Infusions: ? sodium chloride    ? ?PRN Meds: ?sodium chloride, sodium chloride flush  ? ?Vital Signs  ?  ?Vitals:  ? 11/21/21 1707 11/21/21 2006 11/22/21 0034 11/22/21 [id number]  ?BP: 104/70 (!) 123/59 (!) 111/56 114/63  ?Pulse: 73 74 76 74  ?Resp: 17 (!) 21 14 (!) 24  ?Temp: 98.9 ?F (37.2 ?C) 98.2 ?F (36.8 ?C) 98.2 ?F (36.8 ?C) (!) 97.5 ?F (36.4 ?C)  ?TempSrc: Oral Oral Oral Oral  ?SpO2:  98% 97% 97%  ?Weight: 81.1 kg     ?Height: 6' (1.829 m)     ? ? ?Intake/Output Summary (Last 24 hours) at 11/22/2021 0703 ?Last data filed at 11/21/2021 2000 ?Gross per 24 hour  ?Intake 240 ml  ?Output --  ?Net 240 ml  ? ?Filed Weights  ? 11/21/21 1707  ?Weight: 81.1 kg  ? ? ?Physical Exam  ?  ?GEN- The patient is well appearing, alert and oriented x 3 today.   ?Head- normocephalic, atraumatic ?Eyes-  Sclera clear, conjunctiva pink ?Ears- hearing intact ?Oropharynx- clear ?Neck- supple ?Lungs- Clear to ausculation bilaterally, normal work of breathing ?Heart- Regular rate and rhythm, no murmurs, rubs or gallops ?GI- soft, NT, ND, + BS ?Extremities- no clubbing, cyanosis, or edema ?Skin- no rash or lesion ?Psych- euthymic mood, full affect ?Neuro- strength and sensation are intact ? ?Labs  ?  ?CBC ?No results for input(s): WBC, NEUTROABS, HGB, HCT, MCV, PLT in the  last 72 hours. ?Basic Metabolic Panel ?Recent Labs  ?  11/21/21 ?1130 11/22/21 ?0243  ?NA 135 137  ?K 4.8 4.3  ?CL 102 103  ?CO2 25 27  ?GLUCOSE 107* 115*  ?BUN 14 15  ?CREATININE 1.27* 1.20  ?CALCIUM 9.2 8.9  ?MG 2.3 2.3  ? ? ?Potassium  ?Date/Time Value Ref Range Status  ?11/22/2021 02:43 AM 4.3 3.5 - 5.1 mmol/L Final  ? ?Magnesium  ?Date/Time Value Ref Range Status  ?11/22/2021 02:43 AM 2.3 1.7 - 2.4 mg/dL Final  ?  Comment:  ?  Performed at [facility] [address] [place], [place] [address]  ? ? ?Telemetry  ?  ?NSR 70s (personally reviewed) ? ?Radiology  ?  ?No results found. ? ? ?Patient Profile  ?   ?Antonio Curtis is a 81 y.o. male with a past medical history significant for persistent atrial fibrillation.  They were admitted for tikosyn load.  ? ?Assessment & Plan  ?  ?Persistent atrial fibrillation ?Pt  remains in NSR  on Tikosyn 250 mcg BID  ?Continue Eliquis ?Electrolytes stable.  ?CHA2DS2VASC is at least 2. ? ?Patient will not require cardioversion. ? ?2. OSA ?Recently started biPAP ? ?3. SSS s/p Biotronik PPM ?Followed by Dr. Caryl Comes and device clinic.  ? ? ? ?  For questions or updates, please contact [facility] ?Please consult [email or web address] for contact info under Cardiology/STEMI. ? ?Signed, ?Shirley Friar, PA-C  ?11/22/2021, 7:03 AM  ? ?

## 2021-11-22 NOTE — Progress Notes (Signed)
Pharmacy: Dofetilide (Tikosyn) - Follow Up ?Assessment and Electrolyte Replacement ? ?Pharmacy consulted to assist in monitoring and replacing electrolytes in this 81 y.o. male admitted on 11/21/2021 undergoing dofetilide initiation. First dofetilide dose: 11/21/21.  ? ?Labs: ?   ?Component Value Date/Time  ? K 4.3 11/22/2021 0243  ? MG 2.3 11/22/2021 0243  ?  ? ?Plan: ?Potassium: ?K >/= 4: No additional supplementation needed ? ?Magnesium: ?Mg > 2: No additional supplementation needed ? ? ?No potassium supplements required this admit.  ? ?Copay is $14.40, will follow up for pharmacy of choice at discharge.  ? ?Thank you for allowing pharmacy to participate in this patient's care  ? ?Erin Hearing PharmD., BCPS ?Clinical Pharmacist ?11/22/2021 9:21 AM ? ?

## 2021-11-23 ENCOUNTER — Other Ambulatory Visit: Payer: Self-pay

## 2021-11-23 LAB — BASIC METABOLIC PANEL
Anion gap: 5 (ref 5–15)
BUN: 15 mg/dL (ref 8–23)
CO2: 27 mmol/L (ref 22–32)
Calcium: 9.5 mg/dL (ref 8.9–10.3)
Chloride: 105 mmol/L (ref 98–111)
Creatinine, Ser: 1.16 mg/dL (ref 0.61–1.24)
GFR, Estimated: >60 mL/min (ref >60)
Glucose, Bld: 119 mg/dL — ABNORMAL HIGH (ref 70–99)
Potassium: 4.6 mmol/L (ref 3.5–5.1)
Sodium: 137 mmol/L (ref 135–145)

## 2021-11-23 LAB — MAGNESIUM: Magnesium: 2.3 mg/dL (ref 1.7–2.4)

## 2021-11-23 NOTE — Progress Notes (Signed)
? ?Electrophysiology Rounding Note ? ?Patient Name: Antonio Curtis ?Date of Encounter: 11/23/2021 ? ?Primary Cardiologist: Rollene Rotunda, MD  ?Electrophysiologist: Lanier Prude, MD  ? ? ?Subjective  ? ?Pt  went back into an atrial flutter  on Tikosyn 250 mcg BID  ? ?QTc from EKG last pm shows  borderline QTc  at ~490 when measured manually ? ?The patient is doing well today.  At this time, the patient denies chest pain, shortness of breath, or any new concerns. ? ?Inpatient Medications  ?  ?Scheduled Meds: ? apixaban  5 mg Oral BID  ? dofetilide  250 mcg Oral BID  ? levothyroxine  88 mcg Oral QAC breakfast  ? metoprolol tartrate  37.5 mg Oral BID  ? sodium chloride flush  3 mL Intravenous Q12H  ? ?Continuous Infusions: ? sodium chloride    ? ?PRN Meds: ?sodium chloride, sodium chloride flush  ? ?Vital Signs  ?  ?Vitals:  ? 11/22/21 1045 11/22/21 1207 11/22/21 2017 11/23/21 0514  ?BP: 114/71 118/68 129/72 (!) 107/59  ?Pulse: 77 76  (!) 55  ?Resp:  15 (!) 24 17  ?Temp:  97.9 ?F (36.6 ?C) 98.2 ?F (36.8 ?C) 98.7 ?F (37.1 ?C)  ?TempSrc:  Oral Oral Oral  ?SpO2:  97% 97% 97%  ?Weight:      ?Height:      ? ? ?Intake/Output Summary (Last 24 hours) at 11/23/2021 0742 ?Last data filed at 11/22/2021 2015 ?Gross per 24 hour  ?Intake 420 ml  ?Output --  ?Net 420 ml  ? ?Filed Weights  ? 11/21/21 1707  ?Weight: 81.1 kg  ? ? ?Physical Exam  ?  ?GEN- The patient is well appearing, alert and oriented x 3 today.   ?Head- normocephalic, atraumatic ?Eyes-  Sclera clear, conjunctiva pink ?Ears- hearing intact ?Oropharynx- clear ?Neck- supple ?Lungs- Clear to ausculation bilaterally, normal work of breathing ?Heart- Regular rate and rhythm, no murmurs, rubs or gallops ?GI- soft, NT, ND, + BS ?Extremities- no clubbing, cyanosis, or edema ?Skin- no rash or lesion ?Psych- euthymic mood, full affect ?Neuro- strength and sensation are intact ? ?Labs  ?  ?CBC ?No results for input(s): WBC, NEUTROABS, HGB, HCT, MCV, PLT in the last 72  hours. ?Basic Metabolic Panel ?Recent Labs  ?  11/22/21 ?0243 11/23/21 ?0353  ?NA 137 137  ?K 4.3 4.6  ?CL 103 105  ?CO2 27 27  ?GLUCOSE 115* 119*  ?BUN 15 15  ?CREATININE 1.20 1.16  ?CALCIUM 8.9 9.5  ?MG 2.3 2.3  ? ? ?Potassium  ?Date/Time Value Ref Range Status  ?11/23/2021 03:53 AM 4.6 3.5 - 5.1 mmol/L Final  ? ?Magnesium  ?Date/Time Value Ref Range Status  ?11/23/2021 03:53 AM 2.3 1.7 - 2.4 mg/dL Final  ?  Comment:  ?  Performed at [facility], [address], [place], [place] [address]  ? ? ?Telemetry  ?  ?Atrial flutter with variable rates 80-90s (personally reviewed) ? ?Radiology  ?  ?No results found. ? ? ?Patient Profile  ?   ?Antonio Curtis is a 81 y.o. male with a past medical history significant for persistent atrial fibrillation.  They were admitted for tikosyn load.  ? ?Assessment & Plan  ?  ?Persistent atrial fibrillation / flutter ?Pt converted to sinus rhythm but then back to atrial flutter on Tikosyn 250 mcg BID  ?Continue Eliquis ?Electrolytes stable.  ?CHA2DS2VASC is at least 2.  ? ?If pt does not convert chemically, plan on DCCV tomorrow, then would still  plan on discharge on tikosyn.  ? ?For questions or updates, please contact [facility] ?Please consult [email or web address] for contact info under Cardiology/STEMI. ? ?Signed, ?Graciella Freer, PA-C  ?11/23/2021, 7:42 AM  ? ?

## 2021-11-23 NOTE — Progress Notes (Signed)
Mobility Specialist Progress Note ? ? 11/23/21 1725  ?Mobility  ?Activity Ambulated independently in hallway  ?Level of Assistance Independent after set-up  ?Assistive Device None  ?Distance Ambulated (ft) 470 ft  ?Activity Response Tolerated well  ?$Mobility charge 1 Mobility  ? ?Received pt in bed having no complaints and agreeable to mobility. Asymptomatic throughout ambulation, returned back to bed w/ call bell in reach and all needs met. ? ?Pre Mobility: HR,  ?During Mobility: HR ?Post Mobility: HR ? ?Antonio Curtis ?Mobility Specialist ?Phone Number [phone] ? ?

## 2021-11-23 NOTE — Progress Notes (Signed)
Morning EKG reviewed   ? ?Shows has converted to NSR at 77 bpm with stable QTc at ~470 ms. ? ?Continue  Tikosyn 250 mcg BID.  ? ?Plan for home tomorrow if QTc remains stable.  ? ?Graciella Freer, PA-C  ?Pager: [phone]  ?11/23/2021 10:23 AM  ? ?

## 2021-11-23 NOTE — Progress Notes (Signed)
Pharmacy: Dofetilide (Tikosyn) - Follow Up ?Assessment and Electrolyte Replacement ? ?Pharmacy consulted to assist in monitoring and replacing electrolytes in this 81 y.o. male admitted on 11/21/2021 undergoing dofetilide initiation. First dofetilide dose: 11/21/21.  ? ?Labs: ?   ?Component Value Date/Time  ? K 4.6 11/23/2021 0353  ? MG 2.3 11/23/2021 0353  ?  ? ?Plan: ?Potassium: ?K >/= 4: No additional supplementation needed ? ?Magnesium: ?Mg > 2: No additional supplementation needed ? ? ?No potassium supplements required this admit.  ? ?Copay is $14.40, will follow up for pharmacy of choice at discharge.  ? ?Thank you for allowing pharmacy to participate in this patient's care  ? ?Sheppard Coil PharmD., BCPS ?Clinical Pharmacist ?11/23/2021 7:56 AM ? ?

## 2021-11-23 NOTE — Care Management (Signed)
?  Transition of Care (TOC) Screening Note ? ? ?Patient Details  ?Name: Antonio Curtis ?Date of Birth: March 22, 1941 ? ? ?Transition of Care (TOC) CM/SW Contact:    ?Graves-Bigelow, Lamar Laundry, RN ?Phone Number: ?11/23/2021, 11:36 AM ? ? ? ?Transition of Care Department [facility]) has reviewed the patient. Case Manager spoke with the patient regarding Tikosyn cost and pharmacy of choice. Patient received the cost of $85.00 from the insurance company and benefits check completed here was for $14.40. We discussed both prices and the patient is aware to utilize [facility] if needed. Patient is agreeable to have the initial Rx filled via [facility] and Rx refills for 90 day supply sent to [facility] [place] location. No further needs identified at this time.  ?

## 2021-11-24 ENCOUNTER — Other Ambulatory Visit (HOSPITAL_COMMUNITY): Payer: Self-pay

## 2021-11-24 LAB — BASIC METABOLIC PANEL
Anion gap: 6 (ref 5–15)
BUN: 20 mg/dL (ref 8–23)
CO2: 25 mmol/L (ref 22–32)
Calcium: 9.2 mg/dL (ref 8.9–10.3)
Chloride: 106 mmol/L (ref 98–111)
Creatinine, Ser: 1.36 mg/dL — ABNORMAL HIGH (ref 0.61–1.24)
GFR, Estimated: 53 mL/min — ABNORMAL LOW (ref >60)
Glucose, Bld: 95 mg/dL (ref 70–99)
Potassium: 4.5 mmol/L (ref 3.5–5.1)
Sodium: 137 mmol/L (ref 135–145)

## 2021-11-24 LAB — MAGNESIUM: Magnesium: 2.2 mg/dL (ref 1.7–2.4)

## 2021-11-24 SURGERY — CARDIOVERSION
Anesthesia: General

## 2021-11-24 MED ORDER — ACETAMINOPHEN 325 MG PO TABS
650.0000 mg | ORAL_TABLET | ORAL | Status: DC | PRN
  Administered 2021-11-24: 650 mg via ORAL
  Filled 2021-11-24: qty 2

## 2021-11-24 MED ORDER — DOFETILIDE 250 MCG PO CAPS
250.0000 ug | ORAL_CAPSULE | Freq: Two times a day (BID) | ORAL | 6 refills | Status: DC
  Filled 2021-11-24: qty 60, 30d supply, fill #0

## 2021-11-24 NOTE — Discharge Summary (Signed)
? ? ? ?ELECTROPHYSIOLOGY PROCEDURE DISCHARGE SUMMARY  ? ? ?Patient ID: Antonio Curtis,  ?MRN: [id number], DOB/AGE: 1940-12-17 81 y.o. ? ?Admit date: 11/21/2021 ?Discharge date: 11/24/2021 ? ?Primary Care Physician: Marden Noble, MD  ?Primary Cardiologist: Rollene Rotunda, MD  ?Electrophysiologist: Lanier Prude, MD  ? ?Primary Discharge Diagnosis:  ?1.Paroxysmal atrial fibrillation status post Tikosyn loading this admission ? ?Secondary Discharge Diagnosis:  ?2. Paroxysmal atrial flutter ? ?No Known Allergies ? ? ?Procedures This Admission:  ?1.  Tikosyn loading ? ?Brief HPI: ?Antonio Curtis is a 81 y.o. male with a past medical history as noted above.  They were referred to EP in the outpatient setting for treatment options of atrial fibrillation.  Risks, benefits, and alternatives to Tikosyn were reviewed with the patient who wished to proceed.   ? ?Hospital Course:  ?The patient was admitted and Tikosyn was initiated.  Renal function and electrolytes were followed during the hospitalization.  Their QTc remained stable. Pt had  they underwent direct current cardioversion which restored sinus rhythm.  They were monitored until discharge on telemetry which demonstrated A paced rhythm.  On the day of discharge, they were examined by Dr. Lalla Brothers  who considered them stable for discharge to home.  Follow-up has been arranged with the Atrial Fibrillation clinic in approximately 1 week and with  EP APP   in 4 weeks.  ? ?Physical Exam: ?Vitals:  ? 11/23/21 2031 11/24/21 0437 11/24/21 0859 11/24/21 0900  ?BP: 110/67 111/67 119/75   ?Pulse: 70 74 76 71  ?Resp: 18 19 16    ?Temp: 98.3 ?F (36.8 ?C) 98.1 ?F (36.7 ?C)    ?TempSrc: Oral Oral    ?SpO2: 97% 98% 97%   ?Weight:      ?Height:      ? ? ?GEN- The patient is well appearing, alert and oriented x 3 today.   ?HEENT: normocephalic, atraumatic; sclera clear, conjunctiva pink; hearing intact; oropharynx clear; neck supple, no JVP ?Lymph- no cervical lymphadenopathy ?Lungs-  Clear to ausculation bilaterally, normal work of breathing.  No wheezes, rales, rhonchi ?Heart- Regular rate and rhythm, no murmurs, rubs or gallops, PMI not laterally displaced ?GI- soft, non-tender, non-distended, bowel sounds present, no hepatosplenomegaly ?Extremities- no clubbing, cyanosis, or edema; DP/PT/radial pulses 2+ bilaterally ?MS- no significant deformity or atrophy ?Skin- warm and dry, no rash or lesion ?Psych- euthymic mood, full affect ?Neuro- strength and sensation are intact ? ? ?Labs: ?  ?Lab Results  ?Component Value Date  ? WBC 8.6 06/06/2021  ? HGB 14.7 06/06/2021  ? HCT 42.8 06/06/2021  ? MCV 89 06/06/2021  ? PLT 151 06/06/2021  ?  ?Recent Labs  ?Lab 11/24/21 ?0253  ?NA 137  ?K 4.5  ?CL 106  ?CO2 25  ?BUN 20  ?CREATININE 1.36*  ?CALCIUM 9.2  ?GLUCOSE 95  ? ? ? ?Discharge Medications:  ?Allergies as of 11/24/2021   ?No Known Allergies ?  ? ?  ?Medication List  ?  ? ?TAKE these medications   ? ?apixaban 5 MG Tabs tablet ?Commonly known as: Eliquis ?Take 1 tablet (5 mg total) by mouth 2 (two) times daily. ?  ?dofetilide 250 MCG capsule ?Commonly known as: TIKOSYN ?Take 1 capsule (250 mcg total) by mouth 2 (two) times daily. ?  ?levothyroxine 88 MCG tablet ?Commonly known as: SYNTHROID ?Take 88 mcg by mouth at bedtime. ?  ?Metoprolol Tartrate 37.5 MG Tabs ?TAKE 1 TABALET BY MOUTH 2 (TWO) TIMES DAILY. ?  ? ?  ? ? ?Disposition:  ? ?  Follow-up Information   ? ? [facility] Follow up.   ?Specialty: Cardiology ?Why: on 3/31 at 10 am for post tikosyn follow up ?Contact information: ?[address] ?[id number] ?[place] [address] ?[phone] ? ?  ?  ? ?  ?  ? ?  ? ? ?Duration of Discharge Encounter: Greater than 30 minutes including physician time. ? ?Signed, ?Graciella Freer, PA-C  ?11/24/2021 ?11:24 AM ? ? ? ? ?

## 2021-11-24 NOTE — Progress Notes (Signed)
Discharge instructions given to and reviewed with patient. Patient verbalized understanding of all discharge instructions including new medication, when to call the doctor and follow up appointments.  ?

## 2021-11-24 NOTE — Progress Notes (Signed)
Pharmacy: Dofetilide (Tikosyn) - Follow Up ?Assessment and Electrolyte Replacement ? ?Pharmacy consulted to assist in monitoring and replacing electrolytes in this 81 y.o. male admitted on 11/21/2021 undergoing dofetilide initiation. First dofetilide dose: 11/21/21.  ? ?Labs: ?   ?Component Value Date/Time  ? K 4.5 11/24/2021 0253  ? MG 2.2 11/24/2021 0253  ?  ? ?Plan: ?Potassium: ?K >/= 4: No additional supplementation needed ? ?Magnesium: ?Mg > 2: No additional supplementation needed ? ? ?No potassium supplements required this admit.  ? ?Copay is $14.40, will use TOC pharmacy at discharge.  ? ?Thank you for allowing pharmacy to participate in this patient's care  ? ?Sheppard Coil PharmD., BCPS ?Clinical Pharmacist ?11/24/2021 8:03 AM ? ?

## 2021-11-24 NOTE — Progress Notes (Signed)
EKG from yesterday evening 11/23/21 reviewed   ? ?Shows remains in NSR at 71 bpm with stable QTc at ~470-480 ms. ? ?Continue  Tikosyn 250 mcg BID.  ? ?Plan for home this afternoon if QTc remains stable.  ?Electrolytes stable.   ? ?Graciella Freer, PA-C  ?Pager: [phone]  ?11/24/2021 8:21 AM  ? ?

## 2021-11-24 NOTE — Care Management Important Message (Signed)
Important Message ? ?Patient Details  ?Name: Antonio Curtis ?MRN: [id number] ?Date of Birth: 1941/07/13 ? ? ?Medicare Important Message Given:  Yes ? ? ? ? ?Marylene Land  Shyanna Klingel-Martin ?11/24/2021, 2:47 PM ?

## 2021-11-24 NOTE — Progress Notes (Signed)
Patient left unit by wheelchair alert with this RN. Patient left [employer] entrance with son driving, discharged home.  ?

## 2021-11-27 ENCOUNTER — Other Ambulatory Visit (HOSPITAL_COMMUNITY): Payer: Self-pay

## 2021-12-01 ENCOUNTER — Telehealth: Payer: Self-pay

## 2021-12-01 ENCOUNTER — Ambulatory Visit (HOSPITAL_COMMUNITY)
Admit: 2021-12-01 | Discharge: 2021-12-01 | Disposition: A | Discharge status: Home / Self Care | Payer: Medicare Other | Attending: Physician Assistant | Admitting: Physician Assistant

## 2021-12-01 VITALS — BP 116/64 | HR 89 | Ht 72.0 in | Wt 178.4 lb

## 2021-12-01 DIAGNOSIS — I4892 Unspecified atrial flutter: Secondary | ICD-10-CM | POA: Insufficient documentation

## 2021-12-01 DIAGNOSIS — Z7901 Long term (current) use of anticoagulants: Secondary | ICD-10-CM | POA: Diagnosis not present

## 2021-12-01 DIAGNOSIS — D6869 Other thrombophilia: Secondary | ICD-10-CM | POA: Insufficient documentation

## 2021-12-01 DIAGNOSIS — Z79899 Other long term (current) drug therapy: Secondary | ICD-10-CM | POA: Diagnosis not present

## 2021-12-01 DIAGNOSIS — E039 Hypothyroidism, unspecified: Secondary | ICD-10-CM | POA: Diagnosis not present

## 2021-12-01 DIAGNOSIS — Z950 Presence of cardiac pacemaker: Secondary | ICD-10-CM | POA: Insufficient documentation

## 2021-12-01 DIAGNOSIS — I4819 Other persistent atrial fibrillation: Secondary | ICD-10-CM | POA: Diagnosis present

## 2021-12-01 DIAGNOSIS — I484 Atypical atrial flutter: Secondary | ICD-10-CM

## 2021-12-01 DIAGNOSIS — G4733 Obstructive sleep apnea (adult) (pediatric): Secondary | ICD-10-CM | POA: Insufficient documentation

## 2021-12-01 DIAGNOSIS — I495 Sick sinus syndrome: Secondary | ICD-10-CM | POA: Insufficient documentation

## 2021-12-01 LAB — BASIC METABOLIC PANEL
Anion gap: 7 (ref 5–15)
BUN: 13 mg/dL (ref 8–23)
CO2: 26 mmol/L (ref 22–32)
Calcium: 9.2 mg/dL (ref 8.9–10.3)
Chloride: 104 mmol/L (ref 98–111)
Creatinine, Ser: 1.13 mg/dL (ref 0.61–1.24)
GFR, Estimated: >60 mL/min (ref >60)
Glucose, Bld: 117 mg/dL — ABNORMAL HIGH (ref 70–99)
Potassium: 4.9 mmol/L (ref 3.5–5.1)
Sodium: 137 mmol/L (ref 135–145)

## 2021-12-01 LAB — MAGNESIUM: Magnesium: 2.4 mg/dL (ref 1.7–2.4)

## 2021-12-01 MED ORDER — DOFETILIDE 250 MCG PO CAPS
250.0000 ug | ORAL_CAPSULE | Freq: Two times a day (BID) | ORAL | 6 refills | Status: DC
Start: 2021-12-01 — End: 2022-07-16

## 2021-12-01 NOTE — Progress Notes (Signed)
? ? ?Primary Care Physician: Josetta Huddle, MD ?Primary Cardiologist: Dr Percival Spanish  ?Primary Electrophysiologist: Dr Caryl Comes ?Referring Physician: Dr Rayann Heman ? ? ?Antonio Curtis is a 81 y.o. male with a history of SSS s/p PPM, hypothyroid, OSA, and paroxysmal atrial fibrillation who presents for follow up in the [facility]. He reports initially being diagnosed with atrial fibrillation in 2016 after presenting to the ER with numbness down his L side (face, arm and leg) numbness.  He was noted to have afib at the time.  He was also found to be dehydrated at that time. He was placed on flecainide and eliquis.  He did well initially.  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  He had Biotronik PPM implanted by Dr [facility] in [place].  He moved to [facility] 07/2019 and has been followed by Dr Caryl Comes since that time.  Unfortunately, he began having increasing frequency and duration of atrial fibrillation.  Flecainide was switched to rhythmol, with some improvement in his afib. Not felt to be a good candidate for sotalol or dofetilide with QT prolongation. He is now s/p afib ablation with Dr Rayann Heman on 05/20/20. Patient is on Eliquis for a CHADS2VASC score of 2.  ? ?Patient is s/p repeat afib ablation with Dr Rayann Heman on 06/29/21 but unfortunately continued to have a high burden of afib.  ? ?On follow up today, s/p dofetilide admission 3/21-3/24/23. He converted with the medication and did not require DCCV. Patient reports that since leaving the hospital, he has felt "great" and has been working in the yard without limitation. He was surprised to hear he was in atrial flutter today. No bleeding issues on anticoagulation.  ? ?Today, he denies symptoms of palpitations, chest pain, orthopnea, PND, lower extremity edema, dizziness, presyncope, syncope, bleeding, or neurologic sequela. The patient is tolerating medications without difficulties  and is otherwise without complaint today.  ? ? ?Atrial Fibrillation Risk Factors: ? ?he does have symptoms or diagnosis of sleep apnea. ?he is compliant with BiPAP therapy. ?he does not have a history of rheumatic fever. ? ? ?he has a BMI of Body mass index is 24.2 kg/m?Marland KitchenMarland Kitchen ?Filed Weights  ? 12/01/21 [id number]  ?Weight: 80.9 kg  ? ? ? ?Family History  ?Problem Relation Age of Onset  ? Heart disease Father 54  ? Diabetes Father   ? ? ? ?Atrial Fibrillation Management history: ? ?Previous antiarrhythmic drugs: flecainide, propafenone, dofetilide   ?Previous cardioversions: none ?Previous ablations: 05/20/20, 06/29/21 ?CHADS2VASC score: 2 ?Anticoagulation history: Eliquis ? ? ?Past Medical History:  ?Diagnosis Date  ? H/O partial resection of colon   ? Hypothyroid   ? Pacemaker   ? Biotronik [place] 8 DR-T  ? Paroxysmal atrial fibrillation (HCC)   ? Sick sinus syndrome ([place])   ? ?Past Surgical History:  ?Procedure Laterality Date  ? ACHILLES TENDON REPAIR    ? APPENDECTOMY    ? ATRIAL FIBRILLATION ABLATION N/A 05/20/2020  ? Procedure: ATRIAL FIBRILLATION ABLATION;  Surgeon: Thompson Grayer, MD;  Location: [facility];  Service: Cardiovascular;  Laterality: N/A;  ? ATRIAL FIBRILLATION ABLATION N/A 06/29/2021  ? Procedure: ATRIAL FIBRILLATION ABLATION;  Surgeon: Thompson Grayer, MD;  Location: [facility];  Service: Cardiovascular;  Laterality: N/A;  ? COLON SURGERY    ? Hemicolectomy:  [place].    ? PACEMAKER IMPLANT    ? Biotronik PPM implanted in [place] by Dr Joelene Millin.  ? TONSILLECTOMY    ? ? ?  Current Outpatient Medications  ?Medication Sig Dispense Refill  ? apixaban (ELIQUIS) 5 MG TABS tablet Take 1 tablet (5 mg total) by mouth 2 (two) times daily. 180 tablet 1  ? dofetilide (TIKOSYN) 250 MCG capsule Take 1 capsule (250 mcg total) by mouth 2 (two) times daily. 60 capsule 6  ? levothyroxine (SYNTHROID) 88 MCG tablet Take 88 mcg by mouth at bedtime.    ? Metoprolol Tartrate 37.5 MG TABS TAKE 1 TABALET BY MOUTH 2 (TWO)  TIMES DAILY. 180 tablet 3  ? ?No current facility-administered medications for this encounter.  ? ? ?No Known Allergies ? ?Social History  ? ?Socioeconomic History  ? Marital status: Married  ?  Spouse name: Not on file  ? Number of children: Not on file  ? Years of education: Not on file  ? Highest education level: Not on file  ?Occupational History  ? Not on file  ?Tobacco Use  ? Smoking status: Never  ?  Passive exposure: Past  ? Smokeless tobacco: Never  ? Tobacco comments:  ?  Never smoke 11/21/21  ?Vaping Use  ? Vaping Use: Never used  ?Substance and Sexual Activity  ? Alcohol use: Yes  ?  Alcohol/week: 1.0 - 2.0 standard drink  ?  Types: 1 - 2 Glasses of wine per week  ?  Comment: monthly  ? Drug use: Never  ? Sexual activity: Not on file  ?Other Topics Concern  ? Not on file  ?Social History Narrative  ? Five children.  [place]  Retired Pharmacist, community.  ? Lives in [place] with spouse.  ? ?Social Determinants of Health  ? ?Financial Resource Strain: Not on file  ?Food Insecurity: Not on file  ?Transportation Needs: Not on file  ?Physical Activity: Not on file  ?Stress: Not on file  ?Social Connections: Not on file  ?Intimate Partner Violence: Not on file  ? ? ? ?ROS- All systems are reviewed and negative except as per the HPI above. ? ?Physical Exam: ?Vitals:  ? 12/01/21 0937  ?BP: 116/64  ?Pulse: 89  ?Weight: 80.9 kg  ?Height: 6' (1.829 m)  ? ? ?GEN- The patient is a well appearing elderly male, alert and oriented x 3 today.   ?HEENT-head normocephalic, atraumatic, sclera clear, conjunctiva pink, hearing intact, trachea midline. ?Lungs- Clear to ausculation bilaterally, normal work of breathing ?Heart- irregular rate and rhythm, no murmurs, rubs or gallops  ?GI- soft, NT, ND, + BS ?Extremities- no clubbing, cyanosis, or edema ?MS- no significant deformity or atrophy ?Skin- no rash or lesion ?Psych- euthymic mood, full affect ?Neuro- strength and sensation are intact ? ? ?Wt Readings from Last 3 Encounters:   ?12/01/21 80.9 kg  ?11/21/21 81.1 kg  ?11/21/21 81.1 kg  ? ? ?EKG today demonstrates  ?Atypical atrial flutter with variable block, occasional paced beat ?Vent. rate 89 BPM ?PR interval * ms ?QRS duration 94 ms ?QT/QTcB 382/464 ms ? ?Echo 04/20/20 demonstrated  ?1. Left ventricular ejection fraction, by estimation, is 60 to 65%. The  ?left ventricle has normal function. The left ventricle has no regional  ?wall motion abnormalities. Left ventricular diastolic parameters are  ?indeterminate.  ? 2. Right ventricular systolic function is normal. The right ventricular  ?size is normal. There is normal pulmonary artery systolic pressure.  ? 3. Left atrial size was mildly dilated.  ? 4. The mitral valve is normal in structure. Trivial mitral valve  ?regurgitation. No evidence of mitral stenosis.  ? 5. The aortic valve is tricuspid.  Aortic valve regurgitation is not  ?visualized. No aortic stenosis is present.  ? 6. Aortic dilatation noted. Aneurysm of the ascending aorta, measuring 37 mm. There is mild dilatation at the level of the sinuses of Valsalva  ?measuring 38 mm.  ? 7. The inferior vena cava is normal in size with greater than 50%  ?respiratory variability, suggesting right atrial pressure of 3 mmHg.  ? ?Epic records are reviewed at length today ? ?CHA2DS2-VASc Score = 2  ?The patient's score is based upon: ?CHF History: 0 ?HTN History: 0 ?Diabetes History: 0 ?Stroke History: 0 ?Vascular Disease History: 0 ?Age Score: 2 ?Gender Score: 0 ?    ? ? ? ?ASSESSMENT AND PLAN: ?1. Persistent Atrial Fibrillation/atrial flutter ?The patient's CHA2DS2-VASc score is 2, indicating a 2.2% annual risk of stroke.   ?S/p afib ablation with Dr Rayann Heman on 05/20/20 with repeat ablation 06/29/21. ?S/p dofetilide admission 3/21-3/24/23 ?He is back in atrial flutter today but asymptomatic. Will try to check burden on his device to see if he is paroxysmal or persistent.  ?Continue dofetilide 250 mcg BID ?Check bmet/mag today. ?Continue  Eliquis 5 mg BID ?Continue Lopressor 37.5 mg BID ? ?2. Secondary Hypercoagulable State (ICD10:  D68.69) ?The patient is at significant risk for stroke/thromboembolism based upon his CHA2DS2-VASc Score of 2.

## 2021-12-29 ENCOUNTER — Telehealth: Payer: Self-pay | Admitting: Pulmonary Disease

## 2021-12-29 ENCOUNTER — Encounter: Payer: Medicare Other | Admitting: Student

## 2021-12-29 NOTE — Telephone Encounter (Signed)
I called to leave a message for this patient to verify if he was using the Bipap machine.  Waiting on a call back.  ?

## 2021-12-29 NOTE — Telephone Encounter (Signed)
Patient to bring SD card to appointment. Patient phone number is [phone]. ?

## 2021-12-29 NOTE — Telephone Encounter (Signed)
Noted. Nothing further needed at this time.  ? ?Routing to [place] as FYI ?

## 2021-12-29 NOTE — Progress Notes (Deleted)
Electrophysiology Office Note Date: 12/29/2021  ID:  Waino Lessa, DOB 20-Jan-1941, MRN [id number]  PCP: Marden Noble, MD Primary Cardiologist: Rollene Rotunda, MD Electrophysiologist: Lanier Prude, MD   CC: Pacemaker follow-up  Antonio Curtis is a 81 y.o. male seen today for Lanier Prude, MD for routine electrophysiology followup.  Since last being seen in our clinic the patient reports doing ***.  he denies chest pain, palpitations, dyspnea, PND, orthopnea, nausea, vomiting, dizziness, syncope, edema, weight gain, or early satiety.  Device History: Psychologist, clinical PPM implanted 2018 for SSS  Past Medical History:  Diagnosis Date   H/O partial resection of colon    Hypothyroid    Pacemaker    Biotronik [place] 8 DR-T   Paroxysmal atrial fibrillation (HCC)    Sick sinus syndrome (HCC)    Past Surgical History:  Procedure Laterality Date   ACHILLES TENDON REPAIR     APPENDECTOMY     ATRIAL FIBRILLATION ABLATION N/A 05/20/2020   Procedure: ATRIAL FIBRILLATION ABLATION;  Surgeon: [name], MD;  [facility];  Service: Cardiovascular;  Laterality: N/A;   ATRIAL FIBRILLATION ABLATION N/A 06/29/2021   Procedure: ATRIAL FIBRILLATION ABLATION;  Surgeon: [name], MD;  [facility];  Service: Cardiovascular;  Laterality: N/A;   COLON SURGERY     Hemicolectomy:  Polyp.     PACEMAKER IMPLANT     Biotronik PPM implanted in [place] by Dr [name].   TONSILLECTOMY      Current Outpatient Medications  Medication Sig Dispense Refill   apixaban (ELIQUIS) 5 MG TABS tablet Take 1 tablet (5 mg total) by mouth 2 (two) times daily. 180 tablet 1   dofetilide (TIKOSYN) 250 MCG capsule Take 1 capsule (250 mcg total) by mouth 2 (two) times daily. 60 capsule 6   levothyroxine (SYNTHROID) 88 MCG tablet Take 88 mcg by mouth at bedtime.     Metoprolol Tartrate 37.5 MG TABS TAKE 1 TABALET BY MOUTH 2 (TWO) TIMES DAILY. 180 tablet 3   No  current facility-administered medications for this visit.    Allergies:   Patient has no known allergies.   Social History: Social History   Socioeconomic History   Marital status: Married    Spouse name: Not on file   Number of children: Not on file   Years of education: Not on file   Highest education level: Not on file  Occupational History   Not on file  Tobacco Use   Smoking status: Never    Passive exposure: Past   Smokeless tobacco: Never   Tobacco comments:    Never smoke 11/21/21  Vaping Use   Vaping Use: Never used  Substance and Sexual Activity   Alcohol use: Yes    Alcohol/week: 1.0 - 2.0 standard drink    Types: 1 - 2 Glasses of wine per week    Comment: monthly   Drug use: Never   Sexual activity: Not on file  Other Topics Concern   Not on file  Social History Narrative   Five children.  7 Grand.  Retired Education officer, community.   Lives in [place] with spouse.   Social Determinants of Health   Financial Resource Strain: Not on file  Food Insecurity: Not on file  Transportation Needs: Not on file  Physical Activity: Not on file  Stress: Not on file  Social Connections: Not on file  Intimate Partner Violence: Not on file    Family History: Family History  Problem Relation  Age of Onset   Heart disease Father 9   Diabetes Father      Review of Systems: All other systems reviewed and are otherwise negative except as noted above.  Physical Exam: There were no vitals filed for this visit.   GEN- The patient is well appearing, alert and oriented x 3 today.   HEENT: normocephalic, atraumatic; sclera clear, conjunctiva pink; hearing intact; oropharynx clear; neck supple  Lungs- Clear to ausculation bilaterally, normal work of breathing.  No wheezes, rales, rhonchi Heart- Regular rate and rhythm, no murmurs, rubs or gallops  GI- soft, non-tender, non-distended, bowel sounds present  Extremities- no clubbing or cyanosis. No edema MS- no significant  deformity or atrophy Skin- warm and dry, no rash or lesion; PPM pocket well healed Psych- euthymic mood, full affect Neuro- strength and sensation are intact  PPM Interrogation- reviewed in detail today,  See PACEART report  EKG:  EKG is ordered today. Personal review of ekg ordered today shows ***   Recent Labs: 06/06/2021: Hemoglobin 14.7; Platelets 151 12/01/2021: BUN 13; Creatinine, Ser 1.13; Magnesium 2.4; Potassium 4.9; Sodium 137   Wt Readings from Last 3 Encounters:  12/01/21 178 lb 6.4 oz (80.9 kg)  11/21/21 178 lb 12.8 oz (81.1 kg)  11/21/21 178 lb 12.8 oz (81.1 kg)     Other studies Reviewed: Additional studies/ records that were reviewed today include: Previous EP office notes, Previous remote checks, Most recent labwork.   Assessment and Plan:  1. Sick sinus syndrome s/p Biotronik PPM  Normal PPM function See Pace Art report No changes today  2. Persistent atrial fib/flutter EKG today shows *** on Tikosyn 250 mcg BID Continue eliquis BMET/Mg today.    Current medicines are reviewed at length with the patient today.    Labs/ tests ordered today include:  No orders of the defined types were placed in this encounter.    Disposition:   Follow up with Dr. Quentin Ore in 3 months    Signed, Annamaria Helling  12/29/2021 8:28 AM  [facility] [address] [place] [address] [phone] (office) [phone] (fax)

## 2022-01-01 ENCOUNTER — Ambulatory Visit: Payer: Medicare Other | Admitting: Pulmonary Disease

## 2022-01-01 ENCOUNTER — Encounter: Payer: Self-pay | Admitting: Pulmonary Disease

## 2022-01-01 VITALS — BP 126/78 | HR 100 | Temp 98.1°F | Ht 72.0 in | Wt 180.0 lb

## 2022-01-01 DIAGNOSIS — G4733 Obstructive sleep apnea (adult) (pediatric): Secondary | ICD-10-CM

## 2022-01-01 NOTE — Progress Notes (Signed)
? ?      Antonio Curtis    [id number]    Feb 11, 1941 ? ?Primary Care Physician:Gates, Herbie Baltimore, MD ? ?Referring Physician: Josetta Huddle, MD ?[place] ?[address] ?[place],  [place] [address] ? ?Chief complaint:   ?Patient with history of obstructive sleep apnea ?Recently started on BiPAP ? ?HPI: ? ?History of obstructive sleep apnea diagnosed in May 2021 ?-AHI of 17.5, mild oxygen desaturations ? ?Started on CPAP, CPAP was not well-tolerated ?Titrated to BiPAP ?Based off of recent titration to BiPAP was started on 15/11 ? ?Issues with atrial fibrillation are better controlled ?-Previous history of ablation ?-Currently on Tikosyn ? ?Issues with BiPAP ?-Waking up in the middle of the night with puff out cheeks ?-Dryness of his mouth in the mornings-has adjusted humidification settings up and down with suboptimal resolution ? ?His previous study did reveal that he was titrated to BiPAP of 12/8 ? ?History of snoring, no witnessed apneas ?Admits to daytime sleepiness ?Occasional dryness of his mouth in the mornings ?No morning headaches ?No night sweats ?Usually goes to bed about 11 PM, falls asleep easily, multiple awakenings, final wake up time about 6 AM ?Sleep is nonrestorative ? ?No family history of sleep apnea ? ?He is active ? ? ?Outpatient Encounter Medications as of 01/01/2022  ?Medication Sig  ? apixaban (ELIQUIS) 5 MG TABS tablet Take 1 tablet (5 mg total) by mouth 2 (two) times daily.  ? dofetilide (TIKOSYN) 250 MCG capsule Take 1 capsule (250 mcg total) by mouth 2 (two) times daily.  ? levothyroxine (SYNTHROID) 88 MCG tablet Take 88 mcg by mouth at bedtime.  ? Metoprolol Tartrate 37.5 MG TABS TAKE 1 TABALET BY MOUTH 2 (TWO) TIMES DAILY.  ? ?No facility-administered encounter medications on file as of 01/01/2022.  ? ? ?Allergies as of 01/01/2022  ? (No Known Allergies)  ? ? ?Past Medical History:  ?Diagnosis Date  ? H/O partial resection of colon   ? Hypothyroid   ? Pacemaker   ? Biotronik [place] 8 DR-T   ? Paroxysmal atrial fibrillation (HCC)   ? Sick sinus syndrome ([place])   ? ? ?Past Surgical History:  ?Procedure Laterality Date  ? ACHILLES TENDON REPAIR    ? APPENDECTOMY    ? ATRIAL FIBRILLATION ABLATION N/A 05/20/2020  ? Procedure: ATRIAL FIBRILLATION ABLATION;  Surgeon: Thompson Grayer, MD;  Location: [facility];  Service: Cardiovascular;  Laterality: N/A;  ? ATRIAL FIBRILLATION ABLATION N/A 06/29/2021  ? Procedure: ATRIAL FIBRILLATION ABLATION;  Surgeon: Thompson Grayer, MD;  Location: [facility];  Service: Cardiovascular;  Laterality: N/A;  ? COLON SURGERY    ? Hemicolectomy:  Polyp.    ? PACEMAKER IMPLANT    ? Biotronik PPM implanted in [place] by Dr Joelene Millin.  ? TONSILLECTOMY    ? ? ?Family History  ?Problem Relation Age of Onset  ? Heart disease Father 35  ? Diabetes Father   ? ? ?Social History  ? ?Socioeconomic History  ? Marital status: Married  ?  Spouse name: Not on file  ? Number of children: Not on file  ? Years of education: Not on file  ? Highest education level: Not on file  ?Occupational History  ? Not on file  ?Tobacco Use  ? Smoking status: Never  ?  Passive exposure: Past  ? Smokeless tobacco: Never  ? Tobacco comments:  ?  Never smoke 11/21/21  ?Vaping Use  ? Vaping Use: Never used  ?Substance and Sexual Activity  ?  Alcohol use: Yes  ?  Alcohol/week: 1.0 - 2.0 standard drink  ?  Types: 1 - 2 Glasses of wine per week  ?  Comment: monthly  ? Drug use: Never  ? Sexual activity: Not on file  ?Other Topics Concern  ? Not on file  ?Social History Narrative  ? Five children.  [place]  Retired Pharmacist, community.  ? Lives in [place] with spouse.  ? ?Social Determinants of Health  ? ?Financial Resource Strain: Not on file  ?Food Insecurity: Not on file  ?Transportation Needs: Not on file  ?Physical Activity: Not on file  ?Stress: Not on file  ?Social Connections: Not on file  ?Intimate Partner Violence: Not on file  ? ? ?Review of Systems  ?Constitutional:  Negative for fatigue.  ?Respiratory:   Negative for shortness of breath.   ?Cardiovascular:  Negative for chest pain.  ?Psychiatric/Behavioral:  Positive for sleep disturbance.   ? ?Vitals:  ? 01/01/22 0909  ?BP: 126/78  ?Pulse: 100  ?Temp: 98.1 ?F (36.7 ?C)  ?SpO2: 98%  ? ? ? ?Physical Exam ?Constitutional:   ?   Appearance: Normal appearance.  ?HENT:  ?   Head: Normocephalic.  ?Eyes:  ?   Pupils: Pupils are equal, round, and reactive to light.  ?Cardiovascular:  ?   Rate and Rhythm: Normal rate and regular rhythm.  ?   Heart sounds: No murmur heard. ?  No friction rub.  ?Pulmonary:  ?   Effort: No respiratory distress.  ?   Breath sounds: No stridor. No wheezing or rhonchi.  ?Musculoskeletal:  ?   Cervical back: No rigidity or tenderness.  ?Neurological:  ?   Mental Status: He is alert.  ?Psychiatric:     ?   Mood and Affect: Mood normal.  ? ? ?  08/14/2021  ?  3:00 PM  ?Results of the Epworth flowsheet  ?Sitting and reading 3  ?Watching TV 2  ?Sitting, inactive in a public place (e.g. a theatre or a meeting) 0  ?As a passenger in a car for an hour without a break 3  ?Lying down to rest in the afternoon when circumstances permit 3  ?Sitting and talking to someone 0  ?Sitting quietly after a lunch without alcohol 2  ?In a car, while stopped for a few minutes in traffic 0  ?Total score 13  ? ? ? ?Data Reviewed: ?Sleep study from April 2021 shows moderate obstructive sleep apnea with mild oxygen desaturations ? ?Titration study-titrated to BiPAP 12/8-9/25/2021 ?-Did not start BiPAP therapy ? ?Retitration 09/24/2021 ?-Titrated to 15/11 ? ?Compliance data reviewed showing excellent use of BiPAP-nightly machine set at 15/14 ?-Residual AHI of 16.9 ? ?Assessment:  ?History of moderate obstructive sleep apnea ?Atrial fibrillation ? ?Past COVID infection with complete recovery ? ?With ongoing issues of puffiness of his cheeks during the night, dryness, reviewing previous study showing that he was titrated to 12/8 down  ? ?-We agreed on decreasing the pressure  from 15/11 down to 14/9 and then follow-up ? ?Encouraged to give me a call if pressures not well-tolerated ? ?Atrial fibrillation ?-This appears better controlled at present ? ?Difference between BiPAP and CPAP discussed ? ?Plan/Recommendations: ? ?Change in pressure from 15/11-14/9 ? ?Tentative follow-up in 3 months ? ?Encouraged to call with significant concerns ? ?Regular exercises encouraged ? ?Pressure changes may need to be made as tolerated ?-Goal is to have AHI below 5 ? ?Encouraged to call with any significant concerns ? ?Sherrilyn Rist MD ?[facility] ?01/01/2022, 9:36 AM ? ?CC: Josetta Huddle, MD ? ? ? ?

## 2022-01-01 NOTE — Telephone Encounter (Signed)
It looks like we have had the same issue. However he did call us back on 11-23-21 and the note states:  ?I spoke with the patient and reviewed ins. guidelines, that the machine tracks usage between noon and noon, f/u appt is scheduled but not sure what day. We reviewed cleaning, schedule to replace supplies and resupply. I gave the patient the number for the adherence team. SD card download reviewed.:  ? ?Patient has a card to cloud unit ( no internet) and has not brought in DL card at this time.  If you would like we can contact him and ask him to bring in DL card per your request if needed.  ? ?Thank you,  ? ?Brad New ?

## 2022-01-01 NOTE — Patient Instructions (Signed)
I will see you back in about 3 months ? ?We will changed your pressure settings from 15/11-14/9 ? ?You will need to take your card to the DME company to have this done as it cannot be logged into remotely ? ?Call if you are still having significant issues with the air, if you feel you are not getting enough air then to give [place] a call as well ? ?If pressures are well tolerated then we can wait until 3 months ? ?You are welcome to call with any significant concerns ?

## 2022-01-01 NOTE — Progress Notes (Signed)
It looks like we have had the same issue. However he did call us back on 11-23-21 and the note states:  ?I spoke with the patient and reviewed ins. guidelines, that the machine tracks usage between noon and noon, f/u appt is scheduled but not sure what day. We reviewed cleaning, schedule to replace supplies and resupply. I gave the patient the number for the adherence team. SD card download reviewed.:  ? ?Patient has a card to cloud unit ( no internet) and has not brought in DL card at this time.  If you would like we can contact him and ask him to bring in DL card per your request if needed.  ? ?Thank you,  ? ?Brad New ?

## 2022-01-02 ENCOUNTER — Ambulatory Visit (INDEPENDENT_AMBULATORY_CARE_PROVIDER_SITE_OTHER): Payer: Medicare Other

## 2022-01-02 DIAGNOSIS — I495 Sick sinus syndrome: Secondary | ICD-10-CM

## 2022-01-02 LAB — CUP PACEART REMOTE DEVICE CHECK
Date Time Interrogation Session: 20230502102625
Implantable Lead Implant Date: 20181212
Implantable Lead Implant Date: 20181212
Implantable Lead Location: 753859
Implantable Lead Location: 753860
Implantable Lead Model: 377
Implantable Lead Model: 377
Implantable Lead Serial Number: 80523187
Implantable Lead Serial Number: 80573594
Implantable Pulse Generator Implant Date: 20181212
Pulse Gen Model: 407145
Pulse Gen Serial Number: 69192108

## 2022-01-02 NOTE — Progress Notes (Signed)
? ? ?Electrophysiology Office Note ?Date: 01/02/2022 ? ?IDJiraiya Curtis, DOB Feb 26, 1941, MRN [id number] ? ?PCP: Marden Noble, MD ?Primary Cardiologist: Rollene Rotunda, MD ?Electrophysiologist: Lanier Prude, MD  ? ?CC: Pacemaker follow-up ? ?Antonio Curtis is a 81 y.o. male seen today for Lanier Prude, MD for routine electrophysiology followup.  Since last being seen in our clinic the patient reports doing very well. He feels as if he has more energy and is able to do more.  he denies chest pain, palpitations, dyspnea, PND, orthopnea, nausea, vomiting, dizziness, syncope, edema, weight gain, or early satiety. ? ?Device History: ?Psychologist, clinical PPM implanted 2018 for SSS ? ?Past Medical History:  ?Diagnosis Date  ? H/O partial resection of colon   ? Hypothyroid   ? Pacemaker   ? Biotronik [place] 8 DR-T  ? Paroxysmal atrial fibrillation (HCC)   ? Sick sinus syndrome (HCC)   ? ?Past Surgical History:  ?Procedure Laterality Date  ? ACHILLES TENDON REPAIR    ? APPENDECTOMY    ? ATRIAL FIBRILLATION ABLATION N/A 05/20/2020  ? Procedure: ATRIAL FIBRILLATION ABLATION;  Surgeon: [name], MD;  [facility];  Service: Cardiovascular;  Laterality: N/A;  ? ATRIAL FIBRILLATION ABLATION N/A 06/29/2021  ? Procedure: ATRIAL FIBRILLATION ABLATION;  Surgeon: [name], MD;  [facility];  Service: Cardiovascular;  Laterality: N/A;  ? COLON SURGERY    ? Hemicolectomy:  Polyp.    ? PACEMAKER IMPLANT    ? Biotronik PPM implanted in [place] by Dr [name].  ? TONSILLECTOMY    ? ? ?Current Outpatient Medications  ?Medication Sig Dispense Refill  ? apixaban (ELIQUIS) 5 MG TABS tablet Take 1 tablet (5 mg total) by mouth 2 (two) times daily. 180 tablet 1  ? dofetilide (TIKOSYN) 250 MCG capsule Take 1 capsule (250 mcg total) by mouth 2 (two) times daily. 60 capsule 6  ? levothyroxine (SYNTHROID) 88 MCG tablet Take 88 mcg by mouth at bedtime.    ? Metoprolol Tartrate 37.5 MG TABS TAKE 1  TABALET BY MOUTH 2 (TWO) TIMES DAILY. 180 tablet 3  ? ?No current facility-administered medications for this visit.  ? ? ?Allergies:   Patient has no known allergies.  ? ?Social History: ?Social History  ? ?Socioeconomic History  ? Marital status: Married  ?  Spouse name: Not on file  ? Number of children: Not on file  ? Years of education: Not on file  ? Highest education level: Not on file  ?Occupational History  ? Not on file  ?Tobacco Use  ? Smoking status: Never  ?  Passive exposure: Past  ? Smokeless tobacco: Never  ? Tobacco comments:  ?  Never smoke 11/21/21  ?Vaping Use  ? Vaping Use: Never used  ?Substance and Sexual Activity  ? Alcohol use: Yes  ?  Alcohol/week: 1.0 - 2.0 standard drink  ?  Types: 1 - 2 Glasses of wine per week  ?  Comment: monthly  ? Drug use: Never  ? Sexual activity: Not on file  ?Other Topics Concern  ? Not on file  ?Social History Narrative  ? Five children.  7 Grand.  Retired Education officer, community.  ? Lives in [place] with spouse.  ? ?Social Determinants of Health  ? ?Financial Resource Strain: Not on file  ?Food Insecurity: Not on file  ?Transportation Needs: Not on file  ?Physical Activity: Not on file  ?Stress: Not on file  ?Social Connections: Not on file  ?Intimate  Partner Violence: Not on file  ? ? ?Family History: ?Family History  ?Problem Relation Age of Onset  ? Heart disease Father 7  ? Diabetes Father   ? ? ? ?Review of Systems: ?All other systems reviewed and are otherwise negative except as noted above. ? ?Physical Exam: ?There were no vitals filed for this visit.  ? ?GEN- The patient is well appearing, alert and oriented x 3 today.   ?HEENT: normocephalic, atraumatic; sclera clear, conjunctiva pink; hearing intact; oropharynx clear; neck supple  ?Lungs- Clear to ausculation bilaterally, normal work of breathing.  No wheezes, rales, rhonchi ?Heart- Regular rate and rhythm, no murmurs, rubs or gallops  ?GI- soft, non-tender, non-distended, bowel sounds present  ?Extremities- no  clubbing or cyanosis. No edema ?MS- no significant deformity or atrophy ?Skin- warm and dry, no rash or lesion; PPM pocket well healed ?Psych- euthymic mood, full affect ?Neuro- strength and sensation are intact ? ?PPM Interrogation- not interrogated in depth. No alarms; AF burden 38%.  ? ?EKG:  EKG is ordered today. ?Personal review of ekg ordered today shows NSR at 80 bpm with stable QTc  ? ?Recent Labs: ?06/06/2021: Hemoglobin 14.7; Platelets 151 ?12/01/2021: BUN 13; Creatinine, Ser 1.13; Magnesium 2.4; Potassium 4.9; Sodium 137  ? ?Wt Readings from Last 3 Encounters:  ?01/01/22 180 lb (81.6 kg)  ?12/01/21 178 lb 6.4 oz (80.9 kg)  ?11/21/21 178 lb 12.8 oz (81.1 kg)  ?  ? ?Other studies Reviewed: ?Additional studies/ records that were reviewed today include: Previous EP office notes, Previous remote checks, Most recent labwork.  ? ?Assessment and Plan: ? ?1. Sick sinus syndrome s/p Biotronik PPM  ?Normal PPM function ?See [name] Art report ?No changes today ? ?2. Persistent atrial fib/flutter ?EKG today shows stable QTc in NSR on Tikosyn 250 mcg BID ?Burden by brief device interrogation 38%. He notices this far less often than before.  ?Continue eliquis ?BMET/Mg today.  ? ? ?Current medicines are reviewed at length with the patient today.   ? ?Labs/ tests ordered today include:  ?No orders of the defined types were placed in this encounter. ? ? ? ?Disposition:   Follow up with Dr. Lalla Brothers in 3 months  ? ? ?Signed, ?Graciella Freer, PA-C  ?01/02/2022 ?9:43 AM ? ?[facility] ?[address] ?[address] ?[place] [address] ?[phone] (office) ?[phone] (fax)  ?

## 2022-01-05 ENCOUNTER — Telehealth: Payer: Self-pay | Admitting: Pulmonary Disease

## 2022-01-05 NOTE — Telephone Encounter (Signed)
Called and spoke with patient who states that his settings are supposed to be changed on his BIPAP machine. Called and spoke with Brad at [employer] and he states that the settings were changed yesterday. Called and let patient know and he expressed understanding. He states he was told that settings could not be changed remotely and that it had to be done in person. Advised him that it was done remotely. Nothing further needed at this time. ?

## 2022-01-10 ENCOUNTER — Encounter: Payer: Self-pay | Admitting: Student

## 2022-01-10 ENCOUNTER — Ambulatory Visit: Payer: Medicare Other | Admitting: Student

## 2022-01-10 VITALS — BP 112/66 | HR 80 | Ht 72.0 in | Wt 178.4 lb

## 2022-01-10 DIAGNOSIS — I495 Sick sinus syndrome: Secondary | ICD-10-CM

## 2022-01-10 DIAGNOSIS — D6869 Other thrombophilia: Secondary | ICD-10-CM | POA: Diagnosis not present

## 2022-01-10 DIAGNOSIS — I480 Paroxysmal atrial fibrillation: Secondary | ICD-10-CM | POA: Diagnosis not present

## 2022-01-10 LAB — BASIC METABOLIC PANEL
BUN/Creatinine Ratio: 14 (ref 10–24)
BUN: 16 mg/dL (ref 8–27)
CO2: 26 mmol/L (ref 20–29)
Calcium: 9.4 mg/dL (ref 8.6–10.2)
Chloride: 100 mmol/L (ref 96–106)
Creatinine, Ser: 1.16 mg/dL (ref 0.76–1.27)
Glucose: 97 mg/dL (ref 70–99)
Potassium: 4.5 mmol/L (ref 3.5–5.2)
Sodium: 138 mmol/L (ref 134–144)
eGFR: 64 mL/min/{1.73_m2} (ref >59)

## 2022-01-10 LAB — MAGNESIUM: Magnesium: 2.3 mg/dL (ref 1.6–2.3)

## 2022-01-10 NOTE — Patient Instructions (Signed)
Medication Instructions:  ?Your physician recommends that you continue on your current medications as directed. Please refer to the Current Medication list given to you today. ? ?*If you need a refill on your cardiac medications before your next appointment, please call your pharmacy* ? ? ?Lab Work: ?TODAY: BMET, [place] ? ?If you have labs (blood work) drawn today and your tests are completely normal, you will receive your results only by: ?MyChart Message (if you have MyChart) OR ?A paper copy in the mail ?If you have any lab test that is abnormal or we need to change your treatment, we will call you to review the results. ? ? ?Follow-Up: ?At [facility], you and your health needs are our priority.  As part of our continuing mission to provide you with exceptional heart care, we have created designated Provider Care Teams.  These Care Teams include your primary Cardiologist (physician) and Advanced Practice Providers (APPs -  Physician Assistants and Nurse Practitioners) who all work together to provide you with the care you need, when you need it. ? ?We recommend signing up for the patient portal called "MyChart".  Sign up information is provided on this After Visit Summary.  MyChart is used to connect with patients for Virtual Visits (Telemedicine).  Patients are able to view lab/test results, encounter notes, upcoming appointments, etc.  Non-urgent messages can be sent to your provider as well.   ?To learn more about what you can do with MyChart, go to [email or web address].   ? ?Your next appointment:   ?3 month(s) ? ?The format for your next appointment:   ?In Person ? ?Provider:   ?Lars Mage, MD{ ?  ?

## 2022-01-17 NOTE — Progress Notes (Signed)
Remote pacemaker transmission.   

## 2022-04-03 ENCOUNTER — Ambulatory Visit (INDEPENDENT_AMBULATORY_CARE_PROVIDER_SITE_OTHER): Payer: Medicare Other

## 2022-04-03 DIAGNOSIS — I495 Sick sinus syndrome: Secondary | ICD-10-CM

## 2022-04-03 LAB — CUP PACEART REMOTE DEVICE CHECK
Date Time Interrogation Session: 20230801134516
Implantable Lead Implant Date: 20181212
Implantable Lead Implant Date: 20181212
Implantable Lead Location: 753859
Implantable Lead Location: 753860
Implantable Lead Model: 377
Implantable Lead Model: 377
Implantable Lead Serial Number: 80523187
Implantable Lead Serial Number: 80573594
Implantable Pulse Generator Implant Date: 20181212
Pulse Gen Model: 407145
Pulse Gen Serial Number: 69192108

## 2022-04-11 IMAGING — CT CT HEART MORPH/PULM VEIN W/ CM & W/O CA SCORE
1 series · 3 of 18 positions shown, 4 images · non-contrast
Comparison: None.
COMPARISON: None.

Addendum:
EXAM:
OVER-READ INTERPRETATION  CT CHEST

The following report is an over-read performed by radiologist Dr.
Marc Wilhelm Johanns [REDACTED] on 05/16/2020. This
over-read does not include interpretation of cardiac or coronary
anatomy or pathology. The coronary calcium score/coronary CTA
interpretation by the cardiologist is attached.
CLINICAL DATA: 79-year-old male with persistent atrial fibrillation
scheduled for an ablation.
Cardiac CT/CTA
TECHNIQUE: The patient was scanned on a Siemens Somatom scanner.

[Series 288: (person_name) - pulmonary veins · 0.25mm/px · 3 of 18 slices shown, 4 images]
[im 4/18  vessel]
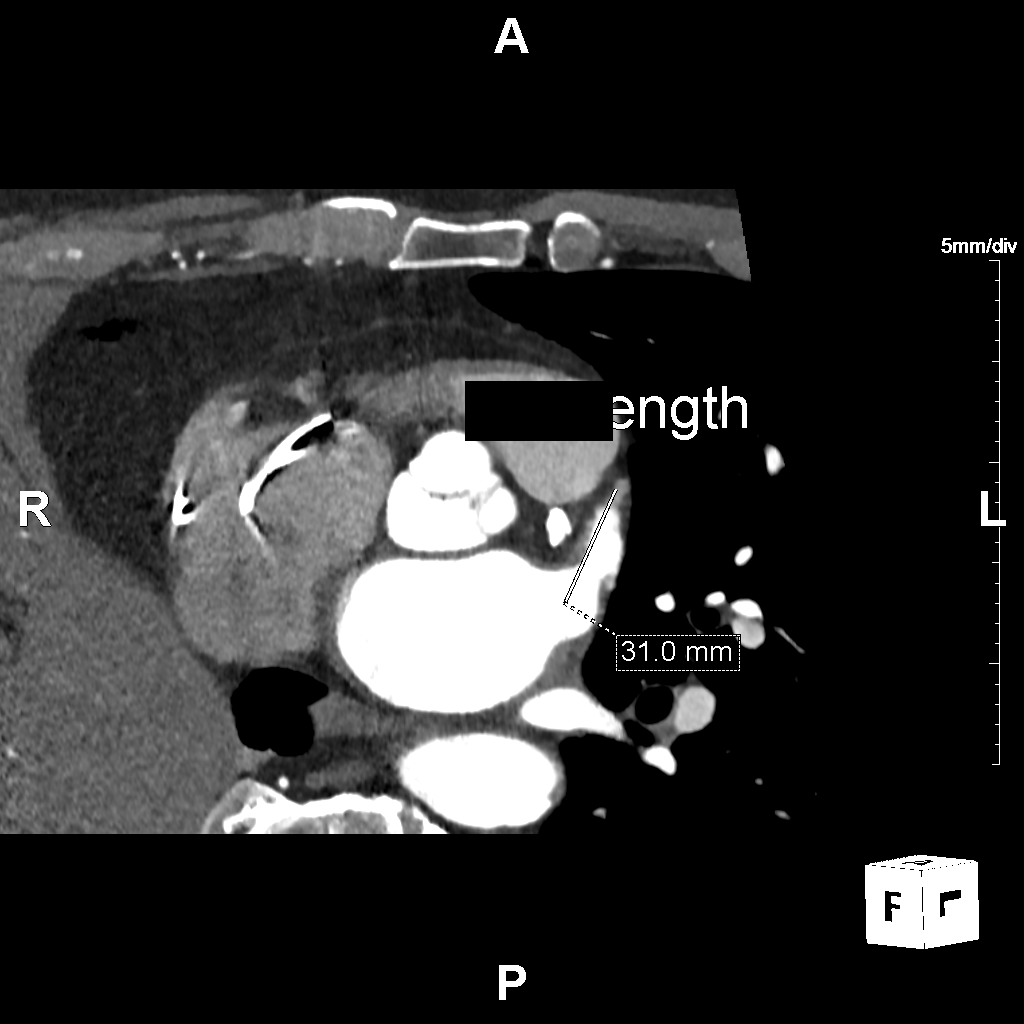
[im 4/18  lung]
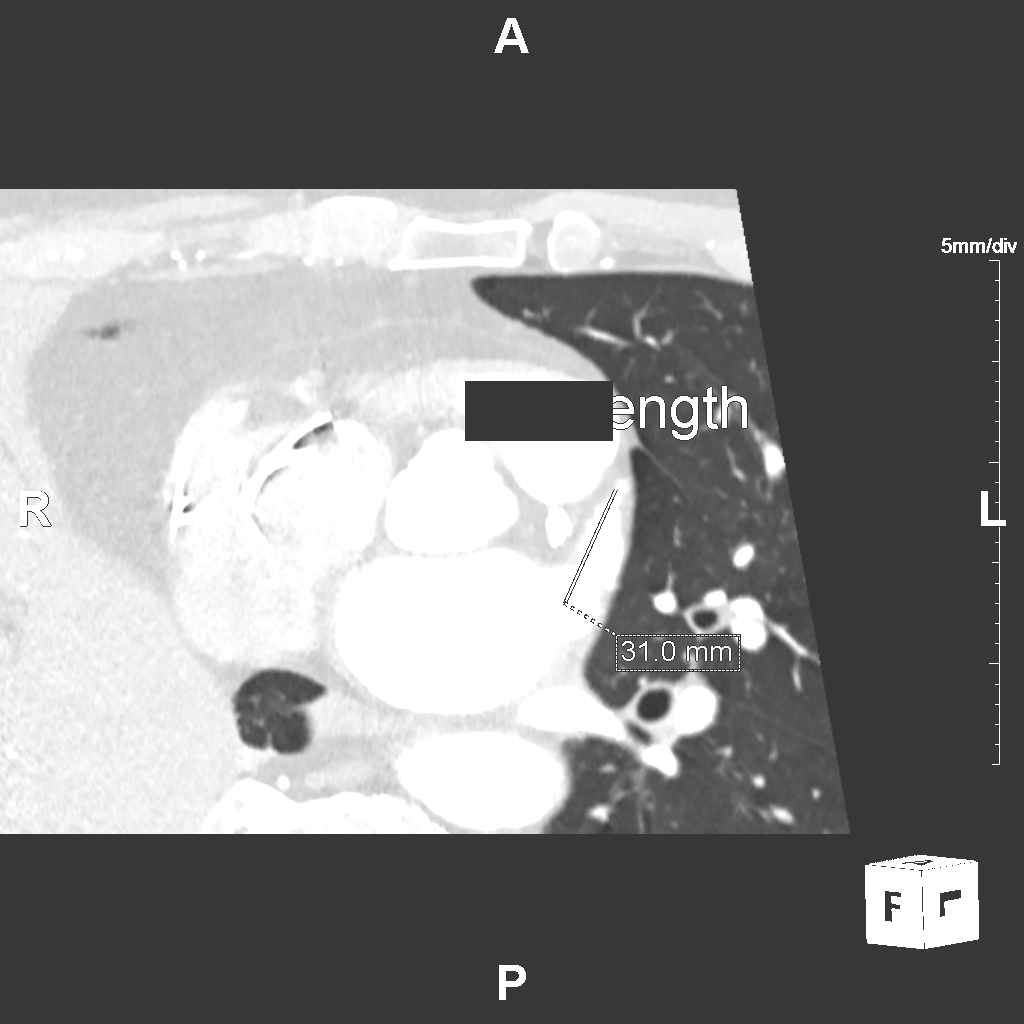
[im 10/18  vessel]
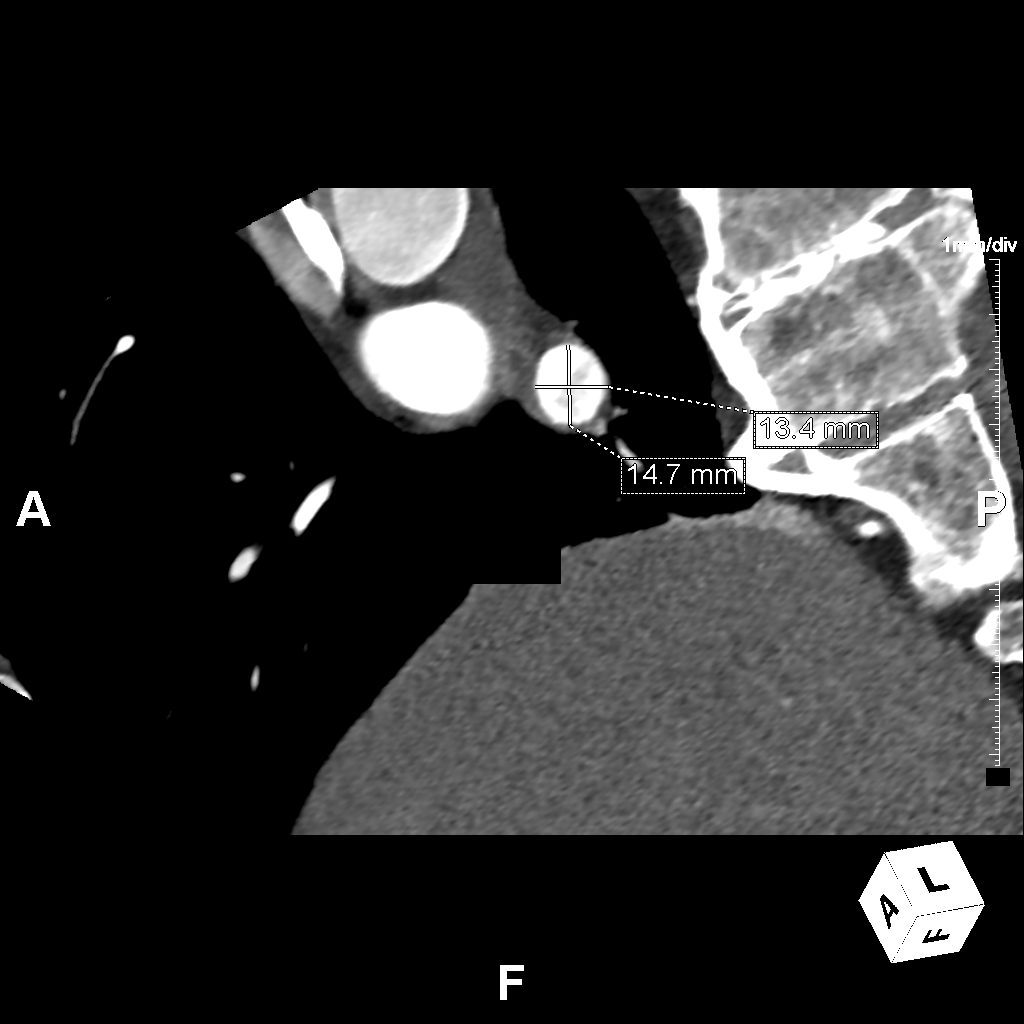
[im 15/18  vessel]
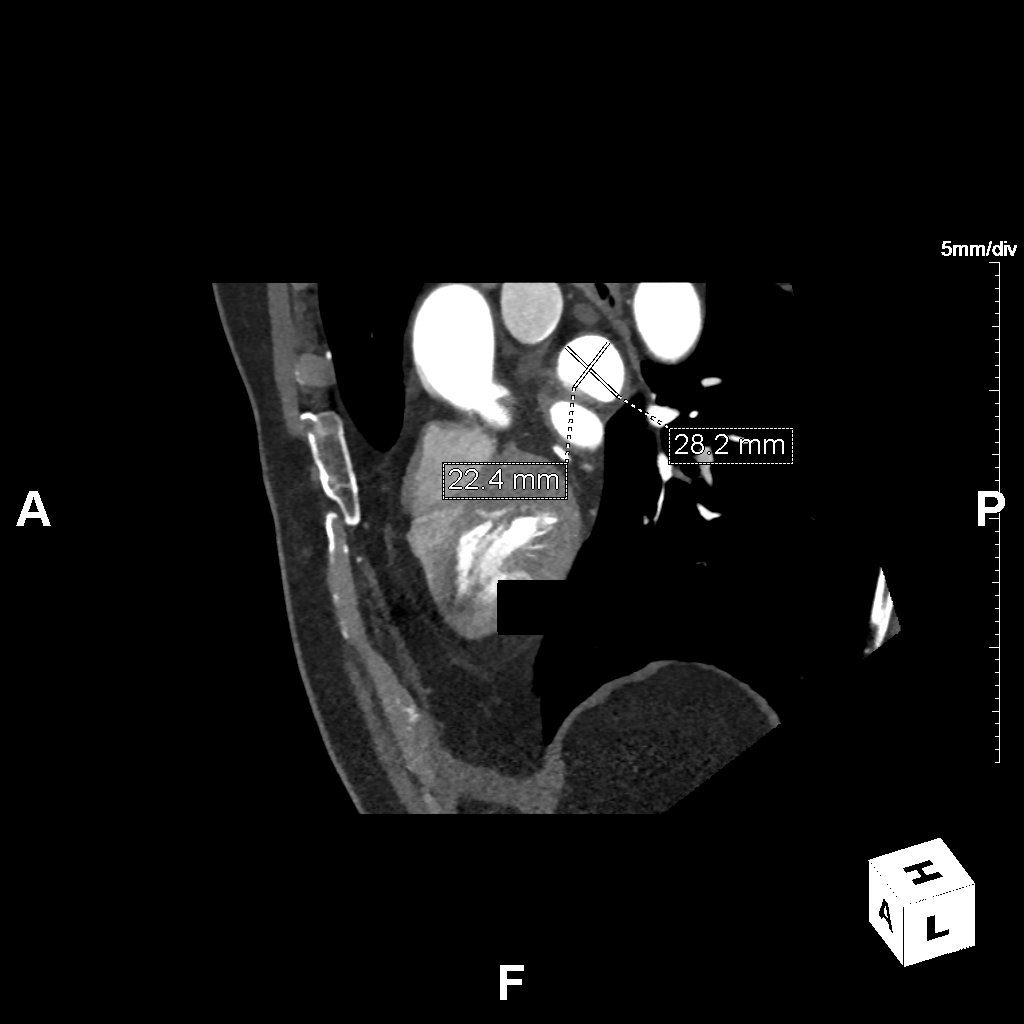

[3 of 18 positions shown; findings below may reference images not displayed]

FINDINGS: Aortic atherosclerosis. Multiple tiny pulmonary nodules scattered
throughout the lungs bilaterally, largest of which is in the
periphery of the right lower lobe (axial image 11 of series 13)
measuring 5 mm. Within the visualized portions of the thorax there
are no other larger more suspicious appearing pulmonary nodules or
masses, there is no acute consolidative airspace disease, no pleural
effusions, no pneumothorax and no lymphadenopathy. Visualized
portions of the upper abdomen demonstrates mild diffuse low
attenuation throughout the visualized hepatic parenchyma, indicative
of hepatic steatosis. There also small calcified gallstones lying
dependently in the gallbladder. There are no aggressive appearing
lytic or blastic lesions noted in the visualized portions of the
skeleton.
IMPRESSION: 1. Multiple tiny pulmonary nodules scattered throughout the lungs
bilaterally measuring 5 mm or less in size. No follow-up needed if
patient is low-risk (and has no known or suspected primary
neoplasm). Non-contrast chest CT can be considered in 12 months if
patient is high-risk. This recommendation follows the consensus
statement: Guidelines for Management of Incidental Pulmonary Nodules
Detected on CT Images: From the [HOSPITAL] 1555; Radiology
1555; [DATE].
2. Hepatic steatosis.
3. Cholelithiasis.
4. Aortic atherosclerosis.
FINDINGS: A 120 kV prospective scan was triggered in the descending thoracic
aorta at 111 HU's. Gantry rotation speed was 280 msecs and
collimation was .9 mm. 50 mg of PO Metoprolol and no NTG was given.
The 3D data set was reconstructed in 5% intervals of the 60-80 % of
the R-R cycle. Diastolic phases were analyzed on a dedicated work
station using MPR, MIP and VRT modes. The patient received 80 cc of
contrast.

There is normal pulmonary vein drainage into the left atrium (2 on
the right and 2 on the left) with ostial measurements as follows:

RUPV: 27.1 x 16.4 mm

RLPV: 14.7 x 13.4 mm

LUPV: 28.2 x 22.4 mm

LLPV: 19.3 x 12.0 mm

The left atrial appendage is relatively small with 1 lobe, chicken
wing morphology and ostial size 24 x 16 mm and length 31 mm. There
is no thrombus in the left atrial appendage.

The esophagus runs in the left atrial midline and is not in the
proximity to any of the pulmonary veins.

Aorta: Normal caliber. Mild diffuse atherosclerotic plaque and
calcifications.

Aortic Valve:  Trileaflet.  Minimal calcifications.

Coronary Arteries: Normal coronary origin. Right dominance. The
study was performed without use of NTG and insufficient for plaque
evaluation.
IMPRESSION: 1. There is normal pulmonary vein drainage into the left atrium.

2. The left atrial appendage is relatively small with 1 lobe,
chicken wing morphology and ostial size 24 x 16 mm and length 31 mm.
There is no thrombus in the left atrial appendage.

3. The esophagus runs in the left atrial midline and is not in the
proximity to any of the pulmonary veins.

*** End of Addendum ***
EXAM:
OVER-READ INTERPRETATION  CT CHEST

The following report is an over-read performed by radiologist Dr.
Marc Wilhelm Johanns [REDACTED] on 05/16/2020. This
over-read does not include interpretation of cardiac or coronary
anatomy or pathology. The coronary calcium score/coronary CTA
interpretation by the cardiologist is attached.
FINDINGS: Aortic atherosclerosis. Multiple tiny pulmonary nodules scattered
throughout the lungs bilaterally, largest of which is in the
periphery of the right lower lobe (axial image 11 of series 13)
measuring 5 mm. Within the visualized portions of the thorax there
are no other larger more suspicious appearing pulmonary nodules or
masses, there is no acute consolidative airspace disease, no pleural
effusions, no pneumothorax and no lymphadenopathy. Visualized
portions of the upper abdomen demonstrates mild diffuse low
attenuation throughout the visualized hepatic parenchyma, indicative
of hepatic steatosis. There also small calcified gallstones lying
dependently in the gallbladder. There are no aggressive appearing
lytic or blastic lesions noted in the visualized portions of the
skeleton.
IMPRESSION: 1. Multiple tiny pulmonary nodules scattered throughout the lungs
bilaterally measuring 5 mm or less in size. No follow-up needed if
patient is low-risk (and has no known or suspected primary
neoplasm). Non-contrast chest CT can be considered in 12 months if
patient is high-risk. This recommendation follows the consensus
statement: Guidelines for Management of Incidental Pulmonary Nodules
Detected on CT Images: From the [HOSPITAL] 1555; Radiology
1555; [DATE].
2. Hepatic steatosis.
3. Cholelithiasis.
4. Aortic atherosclerosis.

## 2022-04-18 ENCOUNTER — Encounter: Payer: Self-pay | Admitting: Cardiology

## 2022-04-18 ENCOUNTER — Ambulatory Visit: Payer: Medicare Other | Admitting: Cardiology

## 2022-04-18 VITALS — BP 108/60 | HR 84 | Ht 72.0 in | Wt 179.6 lb

## 2022-04-18 DIAGNOSIS — I484 Atypical atrial flutter: Secondary | ICD-10-CM

## 2022-04-18 DIAGNOSIS — Z950 Presence of cardiac pacemaker: Secondary | ICD-10-CM

## 2022-04-18 DIAGNOSIS — Z79899 Other long term (current) drug therapy: Secondary | ICD-10-CM

## 2022-04-18 DIAGNOSIS — I495 Sick sinus syndrome: Secondary | ICD-10-CM

## 2022-04-18 DIAGNOSIS — I4811 Longstanding persistent atrial fibrillation: Secondary | ICD-10-CM

## 2022-04-18 NOTE — Progress Notes (Addendum)
Electrophysiology Office Follow up Visit Note:    Date:  04/18/2022   ID:  Antonio Curtis, DOB 09-14-1940, MRN [id number]  PCP:  Marden Noble, MD  [facility] Cardiologist:  Rollene Rotunda, MD  [facility] Electrophysiologist:  Lanier Prude, MD    Interval History:    Antonio Curtis is a 81 y.o. male who presents for a follow up visit.  He has a history of sick sinus syndrome post permanent pacemaker implant in 2018.  The patient also has a history of atrial fibrillation post ablation in 2021.  The patient was last seen by Otilio Saber Jan 10, 2022.  His burden of atrial fibrillation was 38% at that appointment.  He is maintained on Tikosyn 250 mcg by mouth twice daily in addition to Eliquis 5 mg by mouth twice daily.  He also takes metoprolol tartrate twice daily.  Today:  The patient complains of fatigue and shortness of breath on days when he has afib. He has had these symptoms the past 2 days, but no longer has them today. These symptoms occur once to twice a week on average. Once the symptoms start, he is "done for the day." He is overall functioning well despite these episodes, and he is not to the point where his symptoms are controlling his life.        Past Medical History:  Diagnosis Date   H/O partial resection of colon    Hypothyroid    Pacemaker    Biotronik Edora 8 DR-T   Paroxysmal atrial fibrillation (HCC)    Sick sinus syndrome (HCC)     Past Surgical History:  Procedure Laterality Date   ACHILLES TENDON REPAIR     APPENDECTOMY     ATRIAL FIBRILLATION ABLATION N/A 05/20/2020   Procedure: ATRIAL FIBRILLATION ABLATION;  Surgeon: [name], MD;  [facility];  Service: Cardiovascular;  Laterality: N/A;   ATRIAL FIBRILLATION ABLATION N/A 06/29/2021   Procedure: ATRIAL FIBRILLATION ABLATION;  Surgeon: [name], MD;  Location: [facility] INV[facility] LAB;  Service: Cardiovascular;  Laterality: N/A;   COLON SURGERY     Hemicolectomy:   Polyp.     PACEMAKER IMPLANT     Biotronik PPM implanted in [place] by Dr [name].   TONSILLECTOMY      Current Medications: Current Meds  Medication Sig   apixaban (ELIQUIS) 5 MG TABS tablet Take 1 tablet (5 mg total) by mouth 2 (two) times daily.   dofetilide (TIKOSYN) 250 MCG capsule Take 1 capsule (250 mcg total) by mouth 2 (two) times daily.   levothyroxine (SYNTHROID) 88 MCG tablet Take 88 mcg by mouth at bedtime.   Metoprolol Tartrate 37.5 MG TABS TAKE 1 TABALET BY MOUTH 2 (TWO) TIMES DAILY.     Allergies:   Patient has no known allergies.   Social History   Socioeconomic History   Marital status: Married    Spouse name: Not on file   Number of children: Not on file   Years of education: Not on file   Highest education level: Not on file  Occupational History   Not on file  Tobacco Use   Smoking status: Never    Passive exposure: Past   Smokeless tobacco: Never   Tobacco comments:    Never smoke 11/21/21  Vaping Use   Vaping Use: Never used  Substance and Sexual Activity   Alcohol use: Yes    Alcohol/week: 1.0 - 2.0 standard drink of alcohol    Types: 1 - 2  Glasses of wine per week    Comment: monthly   Drug use: Never   Sexual activity: Not on file  Other Topics Concern   Not on file  Social History Narrative   Five children.  [place]  Retired Pharmacist, community.   Lives in [place] with spouse.   Social Determinants of Health   Financial Resource Strain: Not on file  Food Insecurity: Not on file  Transportation Needs: Not on file  Physical Activity: Not on file  Stress: Not on file  Social Connections: Not on file     Family History: The patient's family history includes Diabetes in his father; Heart disease (age of onset: 79) in his father.  ROS:   Please see the history of present illness.   (+) Shortness of breath (+)Fatigue (+)Afib    All other systems reviewed and are negative.  EKGs/Labs/Other Studies Reviewed:    The following studies  were reviewed today:  April 18, 2022 in clinic device interrogation personally reviewed Battery longevity 5 years 8 months Lead parameters stable Atrial pacing 38% Ventricular pacing 14% 31% A-fib burden  Pacemaker Remote Device Check 04/03/22:  Scheduled remote reviewed. Normal device function.    Known AF, controlled rates, burden 28%, previous 38%,  Eliquis  Next remote 91 days  Pacemaker Remote Device Check 01/02/22:  Scheduled remote reviewed. Normal device function.    Known AF, mean HR 84, burden 38%, Eliquis  Increase in PVC's 43/hr  Next remote 91 days.   Atrial Fibrillation Ablation 06/29/21:  CONCLUSIONS: 1. Sinus rhythm upon presentation.   2. Intracardiac echo reveals a moderate sized left atrium with four separate pulmonary veins without evidence of pulmonary vein stenosis. 3. Return of electrical activity within the right superior and right inferior pulmonary veins from the prior ablation.  The left superior and left inferior pulmonary veins were quiescent from the prior ablation.  4. Successful electrical re-isolation and anatomical encircling of the right superior and inferior pulmonary veins.  The left pulmonary veins did not require additional ablation today 5. Additional left atrial ablation was performed with a standard box lesion created along the posterior wall of the left atrium 6. No early apparent complications.  CT Cardiac Morph 06/23/21:  IMPRESSION: 1. There is normal pulmonary vein drainage into the left atrium, compared to re-measurement of 05/16/2020 study, similar sizing from prior (35% phase measurement).   2. The left atrial appendage is small - chicken wing with one dominant lobe and ostial size 23 mm x 17 mm and length 37 mm. There is no thrombus in the left atrial appendage. No change from prior.   3. The esophagus runs in the left atrial midline and is not in the proximity to any of the pulmonary veins.   4. Coronary calcium score of  1165. This was 77th percentile for age, sex, and race matched control.   5.  Dual chamber pacemaker present.   6.  Aortic atherosclerosis.   7.  Mild aortic valve atherosclerosis with calcium score of 110.  Atrial Fibrillation Ablation 05/20/20:  CONCLUSIONS: 1. Atrial fibrillation upon presentation.   2. Intracardiac echo reveals a moderate sized left atrium with four separate pulmonary veins without evidence of pulmonary vein stenosis. 3. Successful electrical isolation and anatomical encircling of all four pulmonary veins with radiofrequency current.  A WACA approach was used 3. Additional left atrial ablation was performed with a standard box lesion created along the posterior wall of the left atrium 4. Atrial fibrillation successfully cardioverted  to sinus rhythm. 5. No early apparent complications.   CT Cardiac Morph 05/16/20:  IMPRESSION: 1. There is normal pulmonary vein drainage into the left atrium.   2. The left atrial appendage is relatively small with 1 lobe, chicken wing morphology and ostial size 24 x 16 mm and length 31 mm. There is no thrombus in the left atrial appendage.   3. The esophagus runs in the left atrial midline and is not in the proximity to any of the pulmonary veins.  Echocardiogram 04/20/20:  1. Left ventricular ejection fraction, by estimation, is 60 to 65%. The  left ventricle has normal function. The left ventricle has no regional  wall motion abnormalities. Left ventricular diastolic parameters are  indeterminate.   2. Right ventricular systolic function is normal. The right ventricular  size is normal. There is normal pulmonary artery systolic pressure.   3. Left atrial size was mildly dilated.   4. The mitral valve is normal in structure. Trivial mitral valve  regurgitation. No evidence of mitral stenosis.   5. The aortic valve is tricuspid. Aortic valve regurgitation is not  visualized. No aortic stenosis is present.   6. Aortic  dilatation noted. Aneurysm of the ascending aorta, measuring 37  mm. There is mild dilatation at the level of the sinuses of Valsalva  measuring 38 mm.   7. The inferior vena cava is normal in size with greater than 50%  respiratory variability, suggesting right atrial pressure of 3 mmHg.    EKG:  The ekg ordered today demonstrates apaced rhythm, QTC 444 ms  Recent Labs: 06/06/2021: Hemoglobin 14.7; Platelets 151 01/10/2022: BUN 16; Creatinine, Ser 1.16; Magnesium 2.3; Potassium 4.5; Sodium 138  Recent Lipid Panel No results found for: "CHOL", "TRIG", "HDL", "CHOLHDL", "VLDL", "LDLCALC", "LDLDIRECT"  Physical Exam:    VS:  BP 108/60   Pulse 84   Ht 6' (1.829 m)   Wt 179 lb 9.6 oz (81.5 kg)   SpO2 97%   BMI 24.36 kg/m     Wt Readings from Last 3 Encounters:  04/18/22 179 lb 9.6 oz (81.5 kg)  01/10/22 178 lb 6.4 oz (80.9 kg)  01/01/22 180 lb (81.6 kg)     GEN:  Well nourished, well developed in no acute distress HEENT: Normal NECK: No JVD; No carotid bruits LYMPHATICS: No lymphadenopathy CARDIAC: RRR, no murmurs, rubs, gallops.  Pacemaker pocket well-healed RESPIRATORY:  Clear to auscultation without rales, wheezing or rhonchi  ABDOMEN: Soft, non-tender, non-distended MUSCULOSKELETAL:  No edema; No deformity  SKIN: Warm and dry NEUROLOGIC:  Alert and oriented x 3 PSYCHIATRIC:  Normal affect        ASSESSMENT:    1. Sinus node dysfunction (HCC)   2. Longstanding persistent atrial fibrillation ([place])   3. 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 Pacemaker   5. Encounter for long-term (current) use of high-risk medication    PLAN:    In order of problems listed above:  #Sinus node dysfunction #Permanent pacemaker in situ Device functioning appropriately.  Continue remote monitoring.  #Atypical atrial flutter and persistent atrial fibrillation 38% A-fib burden on device interrogation on Tikosyn.  On Eliquis for stroke prophylaxis.  Has a history of A-fib ablation in  2021.  During the most recent ablation the right pulmonary veins and posterior wall were ablated.  I did discuss redo ablation with the patient during today's visit.  He will think about his options and let us know how he would like to proceed.    Medication  Adjustments/Labs and Tests Ordered: Current medicines are reviewed at length with the patient today.  Concerns regarding medicines are outlined above.  Orders Placed This Encounter  Procedures   Basic metabolic panel   Magnesium   EKG 12-Lead   No orders of the defined types were placed in this encounter.   I,Mary Shauna Hugh Buren,acting as a scribe for Lanier Prude, MD.,have documented all relevant documentation on the behalf of Lanier Prude, MD,as directed by  Lanier Prude, MD while in the presence of Lanier Prude, MD.   I, Lanier Prude, MD, have reviewed all documentation for this visit. The documentation on 04/18/22 for the exam, diagnosis, procedures, and orders are all accurate and complete.   Signed, Steffanie Dunn, MD, [facility], [facility] 04/18/2022 1:28 PM    Electrophysiology [facility]

## 2022-04-18 NOTE — Patient Instructions (Addendum)
Medication Instructions:  NO CHANGES *If you need a refill on your cardiac medications before your next appointment, please call your pharmacy*   Lab Work: TODAY BMET AND MAG If you have labs (blood work) drawn today and your tests are completely normal, you will receive your results only by: MyChart Message (if you have MyChart) OR A paper copy in the mail If you have any lab test that is abnormal or we need to change your treatment, we will call you to review the results.   Testing/Procedures: NONE   Follow-Up: At [facility], you and your health needs are our priority.  As part of our continuing mission to provide you with exceptional heart care, we have created designated Provider Care Teams.  These Care Teams include your primary Cardiologist (physician) and Advanced Practice Providers (APPs -  Physician Assistants and Nurse Practitioners) who all work together to provide you with the care you need, when you need it.  We recommend signing up for the patient portal called "MyChart".  Sign up information is provided on this After Visit Summary.  MyChart is used to connect with patients for Virtual Visits (Telemedicine).  Patients are able to view lab/test results, encounter notes, upcoming appointments, etc.  Non-urgent messages can be sent to your provider as well.   To learn more about what you can do with MyChart, go to [email or web address].    Your next appointment:   3 month(s)  The format for your next appointment:   In Person  Provider:  RENEE URSUY PA OR ANDY TILLERY  PA  RENEA    Other Instructions NONE   Important Information About Sugar

## 2022-04-18 NOTE — Progress Notes (Deleted)
Electrophysiology Office Follow up Visit Note:    Date:  04/18/2022   ID:  Antonio Curtis, DOB 1941/06/18, MRN [id number]  PCP:  Marden Noble, MD  [facility] Cardiologist:  Rollene Rotunda, MD  [facility] Electrophysiologist:  Lanier Prude, MD    Interval History:    Antonio Curtis is a 81 y.o. male who presents for a follow up visit.  He has a history of sick sinus syndrome post permanent pacemaker implant in 2018.  The patient also has a history of atrial fibrillation post ablation in 2021.  The patient was last seen by Otilio Saber Jan 10, 2022.  His burden of atrial fibrillation was 38% at that appointment.  He is maintained on Tikosyn 250 mcg by mouth twice daily in addition to Eliquis 5 mg by mouth twice daily.  He also takes metoprolol tartrate twice daily.       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     PACEMAKER IMPLANT     Biotronik PPM implanted in [place] by Dr [name].   TONSILLECTOMY      Current Medications: No outpatient medications have been marked as taking for the 04/18/22 encounter (Appointment) with Lanier Prude, MD.     Allergies:   Patient has no known allergies.   Social History   Socioeconomic History   Marital status: Married    Spouse name: Not on  file   Number of children: Not on file   Years of education: Not on file   Highest education level: Not on file  Occupational History   Not on file  Tobacco Use   Smoking status: Never    Passive exposure: Past   Smokeless tobacco: Never   Tobacco comments:    Never smoke 11/21/21  Vaping Use   Vaping Use: Never used  Substance and Sexual Activity   Alcohol use: Yes    Alcohol/week: 1.0 - 2.0 standard drink of alcohol    Types: 1 - 2 Glasses of wine per week    Comment: monthly   Drug use: Never   Sexual activity: Not on file  Other Topics Concern   Not on file  Social History Narrative   Five children.  7 Grand.  Retired Education officer, community.   Lives in [place] with spouse.   Social Determinants of Health   Financial Resource Strain: Not on file  Food Insecurity: Not on file  Transportation Needs: Not on file  Physical Activity: Not on file  Stress: Not on file  Social Connections: Not on file     Family History: The patient's family history includes Diabetes in his father; Heart disease (age of onset: 5) in his father.  ROS:   Please see the history of present illness.  All other systems reviewed and are negative.  EKGs/Labs/Other Studies Reviewed:    The following studies were reviewed today:  April 18, 2022 in clinic device interrogation personally reviewed ***   EKG:  The ekg ordered today demonstrates atrial paced rhythm.  QTc is 444 ms.  Recent Labs: 06/06/2021: Hemoglobin 14.7; Platelets 151 01/10/2022: BUN 16; Creatinine, Ser 1.16; Magnesium 2.3; Potassium 4.5; Sodium 138  Recent Lipid Panel No results found for: "CHOL", "TRIG", "HDL", "CHOLHDL", "VLDL", "LDLCALC", "LDLDIRECT"  Physical Exam:    VS:  There were no vitals taken for this visit.    Wt Readings from Last 3 Encounters:  01/10/22 178 lb 6.4 oz (80.9 kg)  01/01/22 180 lb (81.6 kg)  12/01/21 178 lb 6.4 oz (80.9 kg)     GEN: *** Well nourished, well developed in no acute  distress HEENT: Normal NECK: No JVD; No carotid bruits LYMPHATICS: No lymphadenopathy CARDIAC: ***RRR, no murmurs, rubs, gallops.  Pacemaker pocket well-healed*** RESPIRATORY:  Clear to auscultation without rales, wheezing or rhonchi  ABDOMEN: Soft, non-tender, non-distended MUSCULOSKELETAL:  No edema; No deformity  SKIN: Warm and dry NEUROLOGIC:  Alert and oriented x 3 PSYCHIATRIC:  Normal affect        ASSESSMENT:    1. Sinus node dysfunction (HCC)   2. Longstanding persistent atrial fibrillation (HCC)   3. Atypical atrial flutter (HCC)   4. Pacemaker   5. Encounter for long-term (current) use of high-risk medication    PLAN:    In order of problems listed above:  #Sinus node dysfunction #Permanent pacemaker in situ Device functioning appropriately.  Continue remote monitoring.  #Atypical atrial flutter and persistent atrial fibrillation 38% A-fib burden on device interrogation on Tikosyn.  On Eliquis for stroke prophylaxis.  Has a history of A-fib ablation in 2021.   Change remotes to me      Total time spent with patient today *** minutes. This includes reviewing records, evaluating the patient and coordinating care.   Medication Adjustments/Labs and Tests Ordered: Current medicines are reviewed at length with the patient today.  Concerns regarding medicines are outlined above.  No orders of the defined types were placed in this encounter.  No orders of the defined types were placed in this encounter.    Signed, Steffanie Dunn, MD, [facility], [facility] 04/18/2022 6:07 AM    Electrophysiology [facility]

## 2022-04-19 LAB — BASIC METABOLIC PANEL
BUN/Creatinine Ratio: 13 (ref 10–24)
BUN: 14 mg/dL (ref 8–27)
CO2: 22 mmol/L (ref 20–29)
Calcium: 9.3 mg/dL (ref 8.6–10.2)
Chloride: 99 mmol/L (ref 96–106)
Creatinine, Ser: 1.06 mg/dL (ref 0.76–1.27)
Glucose: 92 mg/dL (ref 70–99)
Potassium: 4.1 mmol/L (ref 3.5–5.2)
Sodium: 137 mmol/L (ref 134–144)
eGFR: 71 mL/min/{1.73_m2} (ref >59)

## 2022-04-19 LAB — MAGNESIUM: Magnesium: 2.3 mg/dL (ref 1.6–2.3)

## 2022-04-24 ENCOUNTER — Encounter: Payer: Self-pay | Admitting: Pulmonary Disease

## 2022-04-24 ENCOUNTER — Ambulatory Visit: Payer: Medicare Other | Admitting: Pulmonary Disease

## 2022-04-24 VITALS — BP 114/68 | HR 76 | Temp 98.0°F | Ht 72.0 in | Wt 179.4 lb

## 2022-04-24 DIAGNOSIS — G4733 Obstructive sleep apnea (adult) (pediatric): Secondary | ICD-10-CM | POA: Diagnosis not present

## 2022-04-24 NOTE — Progress Notes (Addendum)
Antonio Curtis    [id number]    03/11/1941  Primary Care Physician:Gates, Molly Maduro, MD  Referring Physician: Marden Noble, MD [address] [place],  [place] [address]  Chief complaint:   Patient with history of obstructive sleep apnea Recently started on BiPAP  HPI:  History of obstructive sleep apnea diagnosed in May 2021 -AHI of 17.5, mild oxygen desaturations  Retitrated to BiPAP which he has been using regularly Was on BiPAP at 15/11, was having a lot of issues with the mask and pressures, his cheeks puff out during the night We empirically decreased the pressures to 14/9 Pressures are better tolerated  Compliance report however still reveals elevation of his AHI with about 13 central events and 13 obstructive events  He feels his sleep remains about the same Still wakes up after a few hours of sleep sometimes not able to go back to sleep He does use some the fix for oral venting  Issues with atrial fibrillation are better controlled -Previous history of ablation -Currently on Tikosyn -On metoprolol  He does get short of breath on exertion, tries to stay active Just procured a treadmill which he plans to use regularly  He had a previous study where he was titrated to BiPAP of 12/8  History of snoring, no witnessed apneas Admits to daytime sleepiness Occasional dryness of his mouth in the mornings No morning headaches No night sweats Usually goes to bed about 11 PM, falls asleep easily, multiple awakenings, final wake up time about 6 AM Sleep is nonrestorative  No family history of sleep apnea  He is active   Outpatient Encounter Medications as of 04/24/2022  Medication Sig   apixaban (ELIQUIS) 5 MG TABS tablet Take 1 tablet (5 mg total) by mouth 2 (two) times daily.   dofetilide (TIKOSYN) 250 MCG capsule Take 1 capsule (250 mcg total) by mouth 2 (two) times daily.   levothyroxine (SYNTHROID) 88 MCG tablet Take 88 mcg by mouth at  bedtime.   Metoprolol Tartrate 37.5 MG TABS TAKE 1 TABALET BY MOUTH 2 (TWO) TIMES DAILY.   No facility-administered encounter medications on file as of 04/24/2022.    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     PACEMAKER IMPLANT     Biotronik PPM implanted in [place] by Dr [name].   TONSILLECTOMY      Family History  Problem Relation Age of Onset   Heart disease Father 74   Diabetes Father     Social History   Socioeconomic History   Marital status: Married    Spouse name: Not on file   Number of children: Not on file  Years of education: Not on file   Highest education level: Not on file  Occupational History   Not on file  Tobacco Use   Smoking status: Never    Passive exposure: Past   Smokeless tobacco: Never   Tobacco comments:    Never smoke 11/21/21  Vaping Use   Vaping Use: Never used  Substance and Sexual Activity   Alcohol use: Yes    Alcohol/week: 1.0 - 2.0 standard drink of alcohol    Types: 1 - 2 Glasses of wine per week    Comment: monthly   Drug use: Never   Sexual activity: Not on file  Other Topics Concern   Not on file  Social History Narrative   Five children.  7 Grand.  Retired Education officer, community.   Lives in [place]  with spouse.   Social Determinants of Health   Financial Resource Strain: Not on file  Food Insecurity: Not on file  Transportation Needs: Not on file  Physical Activity: Not on file  Stress: Not on file  Social Connections: Not on file  Intimate Partner Violence: Not on file    Review of Systems  Constitutional:  Negative for fatigue.  Respiratory:  Positive for shortness of breath.   Cardiovascular:  Negative for chest pain.  Psychiatric/Behavioral:  Positive for sleep disturbance.     There were no vitals filed for this visit.    Physical Exam Constitutional:      Appearance: Normal appearance.  HENT:     Head: Normocephalic.  Eyes:     Pupils: Pupils are equal, round, and reactive to light.  Cardiovascular:     Rate and Rhythm: Normal rate and regular rhythm.     Heart sounds: No murmur heard.    No friction rub.  Pulmonary:     Effort: No respiratory distress.     Breath sounds: No stridor. No wheezing or rhonchi.  Musculoskeletal:     Cervical back: No rigidity or tenderness.  Neurological:     Mental Status: He is alert.  Psychiatric:        Mood and Affect: Mood normal.      08/14/2021    3:00 PM  Results of the Epworth flowsheet  Sitting and reading 3  Watching TV 2  Sitting, inactive in a public place (e.g. a theatre or a meeting) 0  As a passenger in a car for an hour without a break 3  Lying down to rest in the afternoon when circumstances permit 3  Sitting and talking to someone 0  Sitting quietly after a lunch without alcohol 2  In a car, while stopped for a few minutes in traffic 0  Total score 13     Data Reviewed: Sleep study from April 2021 shows moderate obstructive sleep apnea with mild oxygen desaturations  Titration study-titrated to BiPAP 12/8-9/25/2021 -Did not start BiPAP therapy  Retitration 09/24/2021 -Titrated to 15/11  Compliance data reviewed showing 81% compliance with BiPAP BiPAP set at 14/9 Residual AHI of  26.2  Residual AHI on previous setting of 15/11 was about 16  Assessment:  History of moderate obstructive sleep apnea Atrial fibrillation  Past COVID infection with complete recovery  He continues to have issues with central sleep events, sleep is still not very restorative, 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  We did talk about possibility of using a sleep aid to help sleep maintenance insomnia  We will not make any other changes to BiPAP pressures at present but may  benefit from BiPAP ST  Plan/Recommendations:  Schedule patient for a retitration study to BiPAP ST  Graded exercises as tolerated  Encouraged to call with significant concerns  Goal is to have AHI below 5-10  Encouraged to call with any significant concerns  Tentative follow-up in about 3 months  Virl Diamond MD [facility] 04/24/2022, 8:57 AM  CC: Marden Noble, MD   Patient is being retitrated to BiPAP ST for presence of central events despite adequate use of BiPAP therapy -Central sleep apnea

## 2022-04-24 NOTE — Patient Instructions (Signed)
Schedule for titration study  Needs titration to BiPAP with a backup rate  -Currently still with significant number of central events with an AHI of 26.2 despite good compliance with BiPAP  I will see you back in about 3 months  Call with significant concerns  Graded exercises as tolerated

## 2022-05-02 NOTE — Progress Notes (Signed)
Remote pacemaker transmission.   

## 2022-05-08 ENCOUNTER — Ambulatory Visit (HOSPITAL_BASED_OUTPATIENT_CLINIC_OR_DEPARTMENT_OTHER): Payer: Medicare Other | Attending: Pulmonary Disease | Admitting: Pulmonary Disease

## 2022-05-08 VITALS — Ht 72.0 in | Wt 179.0 lb

## 2022-05-08 DIAGNOSIS — G4733 Obstructive sleep apnea (adult) (pediatric): Secondary | ICD-10-CM | POA: Insufficient documentation

## 2022-05-08 DIAGNOSIS — G4731 Primary central sleep apnea: Secondary | ICD-10-CM | POA: Diagnosis not present

## 2022-05-17 NOTE — Progress Notes (Unsigned)
Cardiology Office Note   Date:  05/18/2022   ID:  Antonio Curtis, DOB 1940/09/19, MRN [id number]  PCP:  Marden Noble, MD Cardiologist:   Rollene Rotunda, MD Referring:  Marden Noble, MD  Chief Complaint  Patient presents with   Palpitations       History of Present Illness: Antonio Curtis is a 81 y.o. male who is referred by Marden Noble, MD for  evaluation of atrial fib.   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   He had atrial fib ablation..    He has lots of paroxysms of this.  This on his device interrogation.  This is despite being on the Tikosyn which he thinks is helped I noted that he still gets lots of atrial fibrillation.  Some days he is very fatigued with it and just does not go out and do things.  Other days he is able to be active.  He is not having any presyncope or syncope.  He is not having any new shortness of breath, PND or orthopnea.  He has had no chest pressure, neck or arm discomfort.  He has had no weight gain or edema.  He is getting reevaluated for his sleep apnea which is still not exceptionally well treated but he is getting active management.  He is on BiPAP.   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      PACEMAKER IMPLANT     Biotronik PPM implanted in [place] by Dr [name].   TONSILLECTOMY       Current Outpatient Medications  Medication Sig Dispense Refill   Cholecalciferol (VITAMIN D3 PO) Take 1,000 Units by mouth daily.     dofetilide (TIKOSYN) 250 MCG capsule Take 1 capsule (250 mcg total) by mouth 2 (two) times daily. 60 capsule 6   ELIQUIS 5 MG TABS tablet TAKE 1 TABLET BY MOUTH TWICE A DAY 180 tablet 1   levothyroxine (SYNTHROID) 88 MCG tablet Take 88 mcg by mouth at bedtime.     Metoprolol Tartrate 37.5 MG TABS TAKE 1 TABALET BY MOUTH 2 (TWO) TIMES DAILY. 180 tablet 3   No current facility-administered medications for this visit.    Allergies:   Patient has no known allergies.    ROS:  Please see the history of present illness.   Otherwise, review of systems are positive for none.   All other systems are reviewed and negative.    PHYSICAL EXAM: VS:  BP 128/66   Pulse 68   Ht 6' (1.829 m)   Wt 179 lb (81.2 kg)   SpO2 97%   BMI 24.28 kg/m  , BMI Body mass index is 24.28 kg/m.  GENERAL:  Well appearing NECK:  No jugular venous distention, waveform within normal limits, carotid upstroke brisk and symmetric, no bruits, no thyromegaly LUNGS:  Clear to auscultation bilaterally CHEST: Well-healed pacemaker pocket HEART:  PMI not displaced or sustained,S1 and S2 within normal limits, no S3, no S4, no clicks, no rubs, no murmurs ABD:  Flat, positive bowel sounds normal in frequency in pitch, no bruits, no rebound, no guarding, no midline pulsatile mass, no hepatomegaly, no splenomegaly EXT:  2 plus pulses throughout, no edema, no cyanosis no clubbing   EKG:  EKG is not ordered today. NA   Recent Labs: 06/06/2021: Hemoglobin 14.7; Platelets 151 04/18/2022: BUN 14; Creatinine, Ser 1.06; Magnesium 2.3; Potassium 4.1; Sodium 137    Lipid Panel No results found for: "CHOL", "TRIG", "HDL", "CHOLHDL", "VLDL", "LDLCALC", "LDLDIRECT"    Wt Readings from Last 3 Encounters:   05/18/22 179 lb (81.2 kg)  05/08/22 179 lb (81.2 kg)  04/24/22 179 lb 6.4 oz (81.4 kg)      Other studies Reviewed: Additional studies/ records that were reviewed today include: Labs Review of the above records demonstrates:  Please see elsewhere in the note.     ASSESSMENT AND PLAN:  ATRIAL FIB:  He tolerates anticoagulation.  Mr. Antonio Curtis has a CHA2DS2 - VASc score of 2.  I sent a note to Dr. Lalla Brothers as we might consider a third ablation and he wants to wait till January.  I will move this appointment up so they can discuss it.  For now he will continue on the meds as listed.   SICK SINUS SYNDROME/PACEMAKER :    I looked at his August interrogation and he is up-to-date.  POOR SLEEP:    He does have sleep apnea.   This is now being actively managed.  He has not wanted to consider an implanted device.   Current medicines are reviewed at length with the patient today.  The patient does not have concerns regarding medicines.  The following changes have been made: None  Labs/ tests ordered today include: None  No orders of the defined types were placed in this encounter.     Disposition:   FU with me in well months.      Signed, Rollene Rotunda, MD  05/18/2022 12:27 PM    [facility]

## 2022-05-18 ENCOUNTER — Ambulatory Visit: Payer: Medicare Other | Attending: Cardiology | Admitting: Cardiology

## 2022-05-18 ENCOUNTER — Encounter: Payer: Self-pay | Admitting: Cardiology

## 2022-05-18 ENCOUNTER — Other Ambulatory Visit: Payer: Self-pay

## 2022-05-18 ENCOUNTER — Other Ambulatory Visit: Payer: Self-pay | Admitting: Internal Medicine

## 2022-05-18 VITALS — BP 128/66 | HR 68 | Ht 72.0 in | Wt 179.0 lb

## 2022-05-18 DIAGNOSIS — I480 Paroxysmal atrial fibrillation: Secondary | ICD-10-CM

## 2022-05-18 DIAGNOSIS — Z72820 Sleep deprivation: Secondary | ICD-10-CM | POA: Diagnosis not present

## 2022-05-18 DIAGNOSIS — I4819 Other persistent atrial fibrillation: Secondary | ICD-10-CM | POA: Diagnosis not present

## 2022-05-18 MED ORDER — METOPROLOL TARTRATE 37.5 MG PO TABS: ORAL_TABLET | ORAL | 3 refills | Status: DC

## 2022-05-18 NOTE — Telephone Encounter (Signed)
Prescription refill request for Eliquis received. Indication: Aflutter  Last office visit: 04/21/22 Lalla Brothers)  Scr: 1.06 (04/18/22) Age: 81 Weight: 81.2kg  Appropriate dose and refill sent to requested pharmacy.

## 2022-05-18 NOTE — Patient Instructions (Signed)
Medication Instructions:  Your physician recommends that you continue on your current medications as directed. Please refer to the Current Medication list given to you today.  *If you need a refill on your cardiac medications before your next appointment, please call your pharmacy*   Lab Work: None   Testing/Procedures: None   Follow-Up: At [facility], you and your health needs are our priority.  As part of our continuing mission to provide you with exceptional heart care, we have created designated Provider Care Teams.  These Care Teams include your primary Cardiologist (physician) and Advanced Practice Providers (APPs -  Physician Assistants and Nurse Practitioners) who all work together to provide you with the care you need, when you need it.  We recommend signing up for the patient portal called "MyChart".  Sign up information is provided on this After Visit Summary.  MyChart is used to connect with patients for Virtual Visits (Telemedicine).  Patients are able to view lab/test results, encounter notes, upcoming appointments, etc.  Non-urgent messages can be sent to your provider as well.   To learn more about what you can do with MyChart, go to [email or web address].    Your next appointment:   1 year(s)  The format for your next appointment:   In Person  Provider:   Rollene Rotunda, MD     Other Instructions Please contact Dr. Lalla Brothers to be seen in January   Important Information About Sugar

## 2022-05-18 NOTE — Telephone Encounter (Signed)
Eliquis 5mg  refill request received. Patient is 81 years old, weight-81.2kg, Crea-1.06 on 04/18/2022, Diagnosis-Afib, and last seen by Dr. 04/20/2022 on 05/18/2022. Dose is appropriate based on dosing criteria.  Refill sent today, this is a duplicate

## 2022-05-22 ENCOUNTER — Telehealth: Payer: Self-pay | Admitting: Pulmonary Disease

## 2022-05-22 DIAGNOSIS — G4733 Obstructive sleep apnea (adult) (pediatric): Secondary | ICD-10-CM

## 2022-05-22 NOTE — Procedures (Addendum)
POLYSOMNOGRAPHY  Last, First: Maxim, Bedel MRN: [id number] Gender: Male Age (years): 81 Weight (lbs): 179 DOB: 1940-11-23 BMI: 24 Primary Care: No PCP Epworth Score: 14 Referring: Laurin Coder MD Technician: Carolin Coy Interpreting: Laurin Coder MD Study Type: BiPAP Ordered Study Type: BiPAP Study date: 05/08/2022 Location:  CLINICAL INFORMATION Antonio Curtis is a 81 year old Male and was referred to the sleep center for evaluation of G47.33 OSA: Adult and Pediatric (327.23). Indications include OSA.   Most recent titration study dated 09/11/2021 revealed an AHI of 2.3/h. MEDICATIONS Patient self administered medications include: LEVOTHYROXINE. Medications administered during study include Sleep medicine administered - None  SLEEP STUDY TECHNIQUE The patient underwent an attended overnight polysomnography titration to assess the effects of BIPAP therapy. The following variables were monitored: EEG(C4-A1, C3-A2, O1-A2, O2-A1), EOG, submental and leg EMG, ECG, oxyhemoglobin saturation by pulse oximetry, thoracic and abdominal respiratory effort belts, nasal/oral airflow by pressure sensor, body position sensor and snoring sensor. BIPAP pressure was titrated to eliminate apneas, hypopneas and oxygen desaturation. Hypopneas were scored per AASM definition IB (4% desaturation)  TECHNICIAN COMMENTS Comments added by Technician: PATIENT WAS ORDERED AS A BIPAP TITRATION Comments added by Scorer: N/A SLEEP ARCHITECTURE The study was initiated at 10:08:10 PM and terminated at 5:09:52 AM. Total recorded time was 421.7 minutes. EEG confirmed total sleep time was 348 minutes yielding a sleep efficiency of 82.5%%. Sleep onset after lights out was 9.0 minutes with a REM latency of 61.0 minutes. The patient spent 18.4%% of the night in stage N1 sleep, 69.8%% in stage N2 sleep, 0.0%% in stage N3 and 11.8% in REM. The Arousal Index was 28.1/hour. RESPIRATORY PARAMETERS The  overall AHI was 18.3 per hour, and the RDI was 25.0 events/hour with a central apnea index of 6.6per hour. The most appropriate setting of BiPAP was 14/10 cm H2O. At this setting, the sleep efficiency was 98 % and the patient was supine for 0%. The AHI was 0 events per hour, and the RDI was 3.8 events/hour (with 6.6 central events) and the arousal index was 11.3 per hour.The oxygen nadir was 93.0% during sleep.    The cumulative time under 88% oxygen saturation was 5.5 minutes  LEG MOVEMENT DATA The total leg movements were 299 with a resulting leg movement index of 51.6. Associated arousal with leg movement index was 3.1. CARDIAC DATA The underlying cardiac rhythm was most consistent with sinus rhythm. Mean heart rate during sleep was 66.3 bpm. Additional rhythm abnormalities include None.  IMPRESSIONS - Moderate Obstructive Sleep apnea(OSA) Optimal pressure attained. - EKG showed no cardiac abnormalities. - Mild Oxygen Desaturation - No snoring was audible during this study. - No significant periodic leg movements(PLMs) during sleep. However, no significant associated arousals.  DIAGNOSIS - Obstructive Sleep Apnea (G47.33) -Central sleep apnea  RECOMMENDATIONS - Trial of BiPAP ST therapy on 14/10 cm H2O, back-up rate of 16 with a Medium size [employer] Full Face Vitera mask and heated humidification. - Avoid alcohol, sedatives and other CNS depressants that may worsen sleep apnea and disrupt normal sleep architecture. - Sleep hygiene should be reviewed to assess factors that may improve sleep quality. - Weight management and regular exercise should be initiated or continued. - Return to Sleep Center for re-evaluation after 4 weeks of therapy  [Electronically signed] 05/22/2022 06:17 AM  Sherrilyn Rist MD NPI: [id number]

## 2022-05-22 NOTE — Telephone Encounter (Signed)
Call patient  Sleep study result  Date of study: 05/08/2022  Impression: Moderate obstructive sleep apnea  Recommendation: DME referral  Trial of BiPAP ST therapy on 14/10 cm H2O, back-up rate of 16 with a Medium size [employer] Full Face Vitera mask and heated humidification.  Encourage weight loss measures  Follow-up in the office 4 to 6 weeks following initiation of treatment

## 2022-05-23 NOTE — Telephone Encounter (Signed)
I left a message for the patient to call back for the sleep study results.

## 2022-05-24 NOTE — Telephone Encounter (Signed)
Patient is returning a call regarding his sleep study results.  Please advise.

## 2022-05-25 NOTE — Telephone Encounter (Signed)
Spoke with the pt and notified of results  He verbalized understanding  Order sent to [facility] set for 07/04/22

## 2022-06-21 ENCOUNTER — Telehealth: Payer: Self-pay | Admitting: Pulmonary Disease

## 2022-06-21 DIAGNOSIS — G4731 Primary central sleep apnea: Secondary | ICD-10-CM

## 2022-06-21 NOTE — Telephone Encounter (Signed)
Patient called to inform the doctor that [place] has been trying to contact the office to get the correct code for the patient's sleep apnea machine.  He stated that there is an error in the code that was sent and the doctor will need to send another request.  Patient would like the doctor to call [employer] to discuss further.  CB# [phone]

## 2022-06-21 NOTE — Telephone Encounter (Signed)
Yes.  Reason for BiPAP ST is for central sleep apnea

## 2022-06-21 NOTE — Telephone Encounter (Signed)
Per Leroy Sea the order would need to state central sleep apnea. Are you ok with placing this diagnoses?

## 2022-06-21 NOTE — Telephone Encounter (Signed)
Called and spoke with patient. He is aware that the order has been updated with the correct diagnosis code.   Nothing further needed at time of call.

## 2022-07-03 ENCOUNTER — Ambulatory Visit (INDEPENDENT_AMBULATORY_CARE_PROVIDER_SITE_OTHER): Payer: Medicare Other

## 2022-07-03 DIAGNOSIS — Z950 Presence of cardiac pacemaker: Secondary | ICD-10-CM | POA: Diagnosis not present

## 2022-07-03 DIAGNOSIS — I495 Sick sinus syndrome: Secondary | ICD-10-CM | POA: Diagnosis not present

## 2022-07-03 LAB — CUP PACEART REMOTE DEVICE CHECK
Date Time Interrogation Session: 20231031064650
Implantable Lead Connection Status: 753985
Implantable Lead Connection Status: 753985
Implantable Lead Implant Date: 20181212
Implantable Lead Implant Date: 20181212
Implantable Lead Location: 753859
Implantable Lead Location: 753860
Implantable Lead Model: 377
Implantable Lead Model: 377
Implantable Lead Serial Number: 80523187
Implantable Lead Serial Number: 80573594
Implantable Pulse Generator Implant Date: 20181212
Pulse Gen Model: 407145
Pulse Gen Serial Number: 69192108

## 2022-07-04 ENCOUNTER — Encounter: Payer: Medicare Other | Admitting: Physician Assistant

## 2022-07-04 ENCOUNTER — Ambulatory Visit: Payer: Medicare Other | Admitting: Pulmonary Disease

## 2022-07-05 ENCOUNTER — Encounter: Payer: Self-pay | Admitting: Cardiology

## 2022-07-05 ENCOUNTER — Ambulatory Visit: Payer: Medicare Other | Attending: Physician Assistant | Admitting: Cardiology

## 2022-07-05 VITALS — BP 112/52 | HR 91 | Ht 72.0 in | Wt 180.0 lb

## 2022-07-05 DIAGNOSIS — I495 Sick sinus syndrome: Secondary | ICD-10-CM

## 2022-07-05 DIAGNOSIS — Z950 Presence of cardiac pacemaker: Secondary | ICD-10-CM | POA: Diagnosis not present

## 2022-07-05 DIAGNOSIS — Z79899 Other long term (current) drug therapy: Secondary | ICD-10-CM

## 2022-07-05 DIAGNOSIS — I484 Atypical atrial flutter: Secondary | ICD-10-CM | POA: Diagnosis not present

## 2022-07-05 DIAGNOSIS — I4819 Other persistent atrial fibrillation: Secondary | ICD-10-CM

## 2022-07-05 DIAGNOSIS — Z01818 Encounter for other preprocedural examination: Secondary | ICD-10-CM

## 2022-07-05 NOTE — Progress Notes (Deleted)
Electrophysiology Office Follow up Visit Note:    Date:  07/05/2022   ID:  Antonio Curtis, DOB June 14, 1941, MRN [id number]  PCP:  Josetta Huddle, MD  [facility] Cardiologist:  Minus Breeding, MD  [facility] Electrophysiologist:  Vickie Epley, MD    Interval History:    Antonio Curtis is a 81 y.o. male who presents for a follow up visit. They were last seen in clinic by [facility] 05/18/2022 and by me 04/18/2022.   From my most recent note:  "He has a history of sick sinus syndrome post permanent pacemaker implant in 2018.  The patient also has a history of atrial fibrillation post ablation in 2021.  The patient was last seen by Oda Kilts Jan 10, 2022.  His burden of atrial fibrillation was 38% at that appointment.  He is maintained on Tikosyn 250 mcg by mouth twice daily in addition to Eliquis 5 mg by mouth twice daily.  He also takes metoprolol tartrate twice daily.   The patient complains of fatigue and shortness of breath on days when he has afib. He has had these symptoms the past 2 days, but no longer has them today. These symptoms occur once to twice a week on average. Once the symptoms start, he is "done for the day." He is overall functioning well despite these episodes, and he is not to the point where his symptoms are controlling his life. "  When he met with [facility], he expressed an interest in moving forward with a repeat ablation. Today he presents to discuss further.     Past Medical History:  Diagnosis Date   H/O partial resection of colon    Hypothyroid    Pacemaker    Biotronik Edora 8 DR-T   Paroxysmal atrial fibrillation (HCC)    Sick sinus syndrome ([place])     Past Surgical History:  Procedure Laterality Date   ACHILLES TENDON REPAIR     APPENDECTOMY     ATRIAL FIBRILLATION ABLATION N/A 05/20/2020   Procedure: ATRIAL FIBRILLATION ABLATION;  Surgeon: Thompson Grayer, MD;  Location: [place] [facility];  Service: Cardiovascular;  Laterality: N/A;   ATRIAL  FIBRILLATION ABLATION N/A 06/29/2021   Procedure: ATRIAL FIBRILLATION ABLATION;  Surgeon: Thompson Grayer, MD;  Location: [place] CV LAB;  Service: Cardiovascular;  Laterality: N/A;   COLON SURGERY     Hemicolectomy:  Polyp.     PACEMAKER IMPLANT     Biotronik PPM implanted in [place] by Dr Joelene Millin.   TONSILLECTOMY      Current Medications: No outpatient medications have been marked as taking for the 07/05/22 encounter (Appointment) with Vickie Epley, MD.     Allergies:   Patient has no known allergies.   Social History   Socioeconomic History   Marital status: Married    Spouse name: Not on file   Number of children: Not on file   Years of education: Not on file   Highest education level: Not on file  Occupational History   Not on file  Tobacco Use   Smoking status: Never    Passive exposure: Past   Smokeless tobacco: Never   Tobacco comments:    Never smoke 11/21/21  Vaping Use   Vaping Use: Never used  Substance and Sexual Activity   Alcohol use: Yes    Alcohol/week: 1.0 - 2.0 standard drink of alcohol    Types: 1 - 2 Glasses of wine per week    Comment: monthly   Drug use: Never  Sexual activity: Not on file  Other Topics Concern   Not on file  Social History Narrative   Five children.  [place]  Retired Pharmacist, community.   Lives in [place] with spouse.   Social Determinants of Health   Financial Resource Strain: Not on file  Food Insecurity: Not on file  Transportation Needs: Not on file  Physical Activity: Not on file  Stress: Not on file  Social Connections: Not on file     Family History: The patient's family history includes Diabetes in his father; Heart disease (age of onset: 110) in his father.  ROS:   Please see the history of present illness.    All other systems reviewed and are negative.  EKGs/Labs/Other Studies Reviewed:    The following studies were reviewed today:   ECG today shows ApVs. QTc 489m.  07/05/2022 in clinic device  interrogation personally reviewed DDD CLS 60-130 Battery longevity [id number] parameters stable 60% A paced 9% Vpaced 16% AF burden   Recent Labs: 04/18/2022: BUN 14; Creatinine, Ser 1.06; Magnesium 2.3; Potassium 4.1; Sodium 137  Recent Lipid Panel No results found for: "CHOL", "TRIG", "HDL", "CHOLHDL", "VLDL", "LDLCALC", "LDLDIRECT"  Physical Exam:    VS:  There were no vitals taken for this visit.    Wt Readings from Last 3 Encounters:  05/18/22 179 lb (81.2 kg)  05/08/22 179 lb (81.2 kg)  04/24/22 179 lb 6.4 oz (81.4 kg)     GEN:  Well nourished, well developed in no acute distress HEENT: Normal NECK: No JVD; No carotid bruits LYMPHATICS: No lymphadenopathy CARDIAC: RRR, no murmurs, rubs, gallops. PPM pocket well healed RESPIRATORY:  Clear to auscultation without rales, wheezing or rhonchi  ABDOMEN: Soft, non-tender, non-distended MUSCULOSKELETAL:  No edema; No deformity  SKIN: Warm and dry NEUROLOGIC:  Alert and oriented x 3 PSYCHIATRIC:  Normal affect        ASSESSMENT:    1. Persistent atrial fibrillation ([facility] [place]  2. Sinus node dysfunction (HCC)   3. Atypical atrial flutter ([place]  4. Pacemaker    PLAN:    In order of problems listed above:  #Persistent AF #Atypical AFL #Dofetilide monitoring  Recurrent. Symptomatic. Most recent ablation 06/29/2021 by JA.  Discussed treatment options today for their AF including antiarrhythmic drug therapy and ablation. Discussed risks, recovery and likelihood of success. Discussed potential need for repeat ablation procedures and antiarrhythmic drugs after an initial ablation. They wish to proceed with scheduling.  Risk, benefits, and alternatives to EP study and radiofrequency ablation for afib were also discussed in detail today. These risks include but are not limited to stroke, bleeding, vascular damage, tamponade, perforation, damage to the esophagus, lungs, and other structures, pulmonary vein stenosis, worsening  renal function, and death. The patient understands these risk and wishes to proceed.  We will therefore proceed with catheter ablation at the next available time.  Carto, ICE, anesthesia are requested for the procedure.  Will also obtain CT PV protocol prior to the procedure to exclude LAA thrombus and further evaluate atrial anatomy.     #SND #PPM in situ Device functioning appropriately. Continue remote monitoring.   Medication Adjustments/Labs and Tests Ordered: Current medicines are reviewed at length with the patient today.  Concerns regarding medicines are outlined above.  No orders of the defined types were placed in this encounter.  No orders of the defined types were placed in this encounter.    Signed, CaLars MageMD, FAWalnut Hill Medical CenterFHPromise Hospital Of [facility]1/10/2021 5:48 AM  Electrophysiology [facility]

## 2022-07-05 NOTE — Patient Instructions (Signed)
Medication Instructions:  No change  *If you need a refill on your cardiac medications before your next appointment, please call your pharmacy*   Lab Work: None  If you have labs (blood work) drawn today and your tests are completely normal, you will receive your results only by: [place] (if you have MyChart) OR A paper copy in the mail If you have any lab test that is abnormal or we need to change your treatment, we will call you to review the results.   Testing/Procedures: Your physician has requested that you have cardiac CT. Cardiac computed tomography (CT) is a painless test that uses an x-ray machine to take clear, detailed pictures of your heart. For further information please visit [email or web address].[email or web address]. Please follow instruction sheet as given.  Your physician has recommended that you have an ablation. Catheter ablation is a medical procedure used to treat some cardiac arrhythmias (irregular heartbeats). During catheter ablation, a long, thin, flexible tube is put into a blood vessel in your groin (upper thigh), or neck. This tube is called an ablation catheter. It is then guided to your heart through the blood vessel. Radio frequency waves destroy small areas of heart tissue where abnormal heartbeats may cause an arrhythmia to start. Please see the instruction sheet given to you today.    Follow-Up: At [facility], you and your health needs are our priority.  As part of our continuing mission to provide you with exceptional heart care, we have created designated Provider Care Teams.  These Care Teams include your primary Cardiologist (physician) and Advanced Practice Providers (APPs -  Physician Assistants and Nurse Practitioners) who all work together to provide you with the care you need, when you need it.  We recommend signing up for the patient portal called "MyChart".  Sign up information is provided on this After Visit Summary.  MyChart is used to connect with  patients for Virtual Visits (Telemedicine).  Patients are able to view lab/test results, encounter notes, upcoming appointments, etc.  Non-urgent messages can be sent to your provider as well.   To learn more about what you can do with MyChart, go to [email or web address].    Your next appointment:   Ablation date picked is Feb 5 and preop lab is Jan 16. Your will meet with the EP staff on your lab day for the CT and Ablation instructions.   Important Information About Sugar

## 2022-07-05 NOTE — Progress Notes (Signed)
Electrophysiology Office Follow up Visit Note:    Date:  07/05/2022   ID:  Antonio Curtis, DOB 1940/11/17, MRN [id number]  PCP:  Josetta Huddle, MD  [facility] Cardiologist:  Minus Breeding, MD  [facility] Electrophysiologist:  Vickie Epley, MD    Interval History:    Antonio Curtis is a 81 y.o. male who presents for a follow up visit. They were last seen in clinic by Antonio Curtis 05/18/2022 and by me 04/18/2022.   From my most recent note:  "He has a history of sick sinus syndrome post permanent pacemaker implant in 2018.  The patient also has a history of atrial fibrillation post ablation in 2021.  The patient was last seen by Antonio Curtis Jan 10, 2022.  His burden of atrial fibrillation was 38% at that appointment.  He is maintained on Tikosyn 250 mcg by mouth twice daily in addition to Eliquis 5 mg by mouth twice daily.  He also takes metoprolol tartrate twice daily.   The patient complains of fatigue and shortness of breath on days when he has afib. He has had these symptoms the past 2 days, but no longer has them today. These symptoms occur once to twice a week on average. Once the symptoms start, he is "done for the day." He is overall functioning well despite these episodes, and he is not to the point where his symptoms are controlling his life. "  When he met with [facility], Antonio Curtis, he expressed an interest in moving forward with a repeat ablation. Today he presents to discuss further.  Today, he confirms having episodes of irregular heart beats intermittently. Most recently he had an episode about 1 hour ago.  He denies any chest pain, shortness of breath, or peripheral edema. No lightheadedness, headaches, syncope, orthopnea, or PND.      Past Medical History:  Diagnosis Date   H/O partial resection of colon    Hypothyroid    Pacemaker    Biotronik Edora 8 DR-T   Paroxysmal atrial fibrillation (HCC)    Sick sinus syndrome ([place])     Past Surgical History:  Procedure Laterality  Date   ACHILLES TENDON REPAIR     APPENDECTOMY     ATRIAL FIBRILLATION ABLATION N/A 05/20/2020   Procedure: ATRIAL FIBRILLATION ABLATION;  Surgeon: Thompson Grayer, MD;  Location: [place] [facility];  Service: Cardiovascular;  Laterality: N/A;   ATRIAL FIBRILLATION ABLATION N/A 06/29/2021   Procedure: ATRIAL FIBRILLATION ABLATION;  Surgeon: Thompson Grayer, MD;  Location: [place] [facility];  Service: Cardiovascular;  Laterality: N/A;   COLON SURGERY     Hemicolectomy:  Polyp.     PACEMAKER IMPLANT     Biotronik PPM implanted in [place] by Dr Joelene Millin.   TONSILLECTOMY      Current Medications: Current Meds  Medication Sig   Cholecalciferol (VITAMIN D3 PO) Take 1,000 Units by mouth daily.   dofetilide (TIKOSYN) 250 MCG capsule Take 1 capsule (250 mcg total) by mouth 2 (two) times daily.   ELIQUIS 5 MG TABS tablet TAKE 1 TABLET BY MOUTH TWICE A DAY   levothyroxine (SYNTHROID) 88 MCG tablet Take 88 mcg by mouth at bedtime.   Metoprolol Tartrate 37.5 MG TABS TAKE 1 TABALET BY MOUTH 2 (TWO) TIMES DAILY.     Allergies:   Patient has no known allergies.   Social History   Socioeconomic History   Marital status: Married    Spouse name: Not on file   Number of children: Not on file  Years of education: Not on file   Highest education level: Not on file  Occupational History   Not on file  Tobacco Use   Smoking status: Never    Passive exposure: Past   Smokeless tobacco: Never   Tobacco comments:    Never smoke 11/21/21  Vaping Use   Vaping Use: Never used  Substance and Sexual Activity   Alcohol use: Yes    Alcohol/week: 1.0 - 2.0 standard drink of alcohol    Types: 1 - 2 Glasses of wine per week    Comment: monthly   Drug use: Never   Sexual activity: Not on file  Other Topics Concern   Not on file  Social History Narrative   Five children.  [place]  Retired Pharmacist, community.   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   Social Determinants of Health   Financial Resource Strain: Not on  file  Food Insecurity: Not on file  Transportation Needs: Not on file  Physical Activity: Not on file  Stress: Not on file  Social Connections: Not on file     Family History: The patient's family history includes Diabetes in his father; Heart disease (age of onset: 63) in his father.  ROS:   Please see the history of present illness.    (+) Palpitations All other systems reviewed and are negative.  EKGs/Labs/Other Studies Reviewed:    The following studies were reviewed today:    ECG today shows ApVs. QTc 426m.   07/05/2022 in clinic device interrogation personally reviewed DDD CLS 60-130 Battery longevity 5y434mead parameters stable 60% A paced 9% Vpaced 16% AF burden   Recent Labs: 04/18/2022: BUN 14; Creatinine, Ser 1.06; Magnesium 2.3; Potassium 4.1; Sodium 137   Recent Lipid Panel No results found for: "CHOL", "TRIG", "HDL", "CHOLHDL", "VLDL", "LDLCALC", "LDLDIRECT"  Physical Exam:    VS:  BP (!) 112/52   Pulse 91   Ht 6' (1.829 m)   Wt 180 lb (81.6 kg)   SpO2 97%   BMI 24.41 kg/m     Wt Readings from Last 3 Encounters:  07/05/22 180 lb (81.6 kg)  05/18/22 179 lb (81.2 kg)  05/08/22 179 lb (81.2 kg)     GEN: Well nourished, well developed in no acute distress HEENT: Normal NECK: No JVD; No carotid bruits LYMPHATICS: No lymphadenopathy CARDIAC: RRR, no murmurs, rubs, gallops. PPM pocket well healed. RESPIRATORY:  Clear to auscultation without rales, wheezing or rhonchi  ABDOMEN: Soft, non-tender, non-distended MUSCULOSKELETAL:  No edema; No deformity  SKIN: Warm and dry NEUROLOGIC:  Alert and oriented x 3 PSYCHIATRIC:  Normal affect       ASSESSMENT:    1. Persistent atrial fibrillation ([place]  2. Sinus node dysfunction (HCC)   3. Atypical atrial flutter ([facility]  4. Pacemaker   5. Encounter for long-term (current) use of high-risk medication    PLAN:    In order of problems listed above:  #Persistent AF #Atypical AFL #Dofetilide  monitoring  Recurrent. Symptomatic. Most recent ablation 06/29/2022 by JA.  Discussed treatment options today for their AF including antiarrhythmic drug therapy and ablation. Discussed risks, recovery and likelihood of success. Discussed potential need for repeat ablation procedures and antiarrhythmic drugs after an initial ablation. They wish to proceed with scheduling.  Risk, benefits, and alternatives to EP study and radiofrequency ablation for afib were also discussed in detail today. These risks include but are not limited to stroke, bleeding, vascular damage, tamponade, perforation, damage to the esophagus,  lungs, and other structures, pulmonary vein stenosis, worsening renal function, and death. The patient understands these risk and wishes to proceed.  We will therefore proceed with catheter ablation at the next available time.  Carto, ICE, anesthesia are requested for the procedure.  Will also obtain CT PV protocol prior to the procedure to exclude LAA thrombus and further evaluate atrial anatomy.     #SND #PPM in situ Device functioning appropriately. Continue remote monitoring.   Medication Adjustments/Labs and Tests Ordered: Current medicines are reviewed at length with the patient today.  Concerns regarding medicines are outlined above.   No orders of the defined types were placed in this encounter.  No orders of the defined types were placed in this encounter.  I,Mathew Stumpf,acting as a Education administrator for Vickie Epley, MD.,have documented all relevant documentation on the behalf of Vickie Epley, MD,as directed by  Vickie Epley, MD while in the presence of Vickie Epley, MD.  I, Vickie Epley, MD, have reviewed all documentation for this visit. The documentation on 07/05/22 for the exam, diagnosis, procedures, and orders are all accurate and complete.   Signed, Lars Mage, MD, [facility], [facility] 07/05/2022 1:49 PM    Electrophysiology [facility]

## 2022-07-14 ENCOUNTER — Other Ambulatory Visit (HOSPITAL_COMMUNITY): Payer: Self-pay | Admitting: Physician Assistant

## 2022-07-16 ENCOUNTER — Other Ambulatory Visit: Payer: Self-pay

## 2022-07-16 MED ORDER — DOFETILIDE 250 MCG PO CAPS: 250.0000 ug | ORAL_CAPSULE | Freq: Two times a day (BID) | ORAL | 3 refills | Status: DC

## 2022-07-16 NOTE — Telephone Encounter (Signed)
Pt's medication was sent to pt's pharmacy as requested. Confirmation received.  °

## 2022-07-18 NOTE — Progress Notes (Signed)
Remote pacemaker transmission.   

## 2022-08-07 ENCOUNTER — Telehealth: Payer: Self-pay | Admitting: Cardiology

## 2022-08-07 NOTE — Telephone Encounter (Signed)
Patient stated he has an ablation scheduled for 2/5 but he will be out of town and would like to reschedule to a later date.

## 2022-08-09 NOTE — Telephone Encounter (Signed)
Patient is following up, again requesting to reschedule 02/05 ablation.

## 2022-08-09 NOTE — Telephone Encounter (Signed)
Spoke with the patient and rescheduled his ablation for March 22nd at 10:30.  Patient's lab work has been rescheduled.  Message has been sent to CT schedulers so that CT can be rescheduled.

## 2022-08-15 NOTE — Telephone Encounter (Signed)
Work up is complete for Afib Ablation...  I have mailed instructions for CT and Ablation and left a message for pt letting him know I had mailed them.

## 2022-09-07 ENCOUNTER — Ambulatory Visit: Payer: Medicare Other | Admitting: Pulmonary Disease

## 2022-09-07 ENCOUNTER — Encounter: Payer: Self-pay | Admitting: Pulmonary Disease

## 2022-09-07 VITALS — BP 112/60 | HR 70 | Temp 97.7°F | Ht 72.0 in | Wt 180.0 lb

## 2022-09-07 DIAGNOSIS — G4731 Primary central sleep apnea: Secondary | ICD-10-CM | POA: Diagnosis not present

## 2022-09-07 NOTE — Patient Instructions (Signed)
Continue using BiPAP nightly  Try and get about 6 hours of sleep nightly  The sensation of an extra breath here and there is because the machine is currently set to give you 16 breaths a minute, if this becomes an issue, you can always send also message and we can back off on the breathing support,-the pressures will not be changed we will still be 14/10  Good luck with your ablation procedure  Call with significant concerns  Follow-up in 6 months

## 2022-09-07 NOTE — Progress Notes (Signed)
Antonio Curtis    [id number]    1941-01-04  Primary Care Physician:Gates, Herbie [place], MD  Referring Physician: Josetta Huddle, MD [place]. [employer] [address],  [place] [address]  Chief complaint:   Patient with history of obstructive sleep apnea On BiPAP ST  HPI:  BiPAP was recently switched to BiPAP ST 14/10 with a backup rate of 16 Sleeping better  He does have times whenever he is able to sleep through the night, sometimes wakes up once or twice, able to go back to sleep  Feels a touch better overall  Not having significant issues with the BiPAP  Does not feel any significant issues with the pressure settings  Previous AHI on previous machine settings was an AHI of 26, compliance data currently shows an AHI of 14.4  Retitrated to BiPAP which he has been using regularly Was on BiPAP at 15/11, was having a lot of issues with the mask and pressures, his cheeks puff out during the night We empirically decreased the pressures to 14/9 -Had a retitration study to BiPAP ST which is what he is using at present  Issues with atrial fibrillation are better controlled -Has about 1-2 events a week -Previous history of ablation -Scheduled for ablation in March 2024  He does get short of breath on exertion, tries to stay active -He does feel limited whenever is A-fib is acting up  Has had multiple sleep studies, there were reviewed  History of snoring, no witnessed apneas Admits to daytime sleepiness Occasional dryness of his mouth in the mornings No morning headaches No night sweats Usually goes to bed about 11 PM, falls asleep easily, multiple awakenings, final wake up time about 6 AM  No family history of sleep apnea  He is active   Outpatient Encounter Medications as of 09/07/2022  Medication Sig   Cholecalciferol (VITAMIN D3 PO) Take 1,000 Units by mouth daily.   cyanocobalamin 2000 MCG tablet Take 2,000 mcg by mouth daily.   dofetilide (TIKOSYN) 250  MCG capsule Take 1 capsule (250 mcg total) by mouth 2 (two) times daily.   ELIQUIS 5 MG TABS tablet TAKE 1 TABLET BY MOUTH TWICE A DAY   levothyroxine (SYNTHROID) 88 MCG tablet Take 88 mcg by mouth at bedtime.   Metoprolol Tartrate 37.5 MG TABS TAKE 1 TABALET BY MOUTH 2 (TWO) TIMES DAILY.   No facility-administered encounter medications on file as of 09/07/2022.    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-T   Paroxysmal atrial fibrillation (HCC)    Sick sinus syndrome ([place])     Past Surgical History:  Procedure Laterality Date   ACHILLES TENDON REPAIR     APPENDECTOMY     ATRIAL FIBRILLATION ABLATION N/A 05/20/2020   Procedure: ATRIAL FIBRILLATION ABLATION;  Surgeon: Thompson Grayer, MD;  Location: [facility];  Service: Cardiovascular;  Laterality: N/A;   ATRIAL FIBRILLATION ABLATION N/A 06/29/2021   Procedure: ATRIAL FIBRILLATION ABLATION;  Surgeon: Thompson Grayer, MD;  Location: [facility];  Service: Cardiovascular;  Laterality: N/A;   COLON SURGERY     Hemicolectomy:  Polyp.     PACEMAKER IMPLANT     Biotronik PPM implanted in [place] by Dr Joelene Millin.   TONSILLECTOMY      Family History  Problem Relation Age of Onset   Heart disease Father 53   Diabetes Father     Social History   Socioeconomic History   Marital status: Married    Spouse name: Not on file   Number of children: Not on file   Years of education: Not on file   Highest education level: Not on file  Occupational History   Not on file  Tobacco Use   Smoking status: Never    Passive exposure: Past   Smokeless tobacco: Never   Tobacco comments:    Never smoke 11/21/21  Vaping Use   Vaping Use: Never used  Substance and Sexual Activity   Alcohol use: Yes    Alcohol/week: 1.0 - 2.0 standard drink of alcohol    Types: 1 - 2 Glasses of wine per week     Comment: monthly   Drug use: Never   Sexual activity: Not on file  Other Topics Concern   Not on file  Social History Narrative   Five children.  7 Grand.  Retired Education officer, community.   Lives in [place] with spouse.   Social Determinants of Health   Financial Resource Strain: Not on file  Food Insecurity: Not on file  Transportation Needs: Not on file  Physical Activity: Not on file  Stress: Not on file  Social Connections: Not on file  Intimate Partner Violence: Not on file    Review of Systems  Constitutional:  Negative for fatigue.  Respiratory:  Positive for apnea and shortness of breath.   Cardiovascular:  Negative for chest pain.  Psychiatric/Behavioral:  Positive for sleep disturbance.     Vitals:   09/07/22 0837  BP: 112/60  Pulse: 70  Temp: 97.7 F (36.5 C)  SpO2: 99%      Physical Exam Constitutional:      Appearance: Normal appearance.  HENT:     Head: Normocephalic.     Mouth/Throat:     Mouth: Mucous membranes are moist.  Eyes:     Pupils: Pupils are equal, round, and reactive to light.  Cardiovascular:     Rate and Rhythm: Normal rate and regular rhythm.     Heart sounds: No murmur heard.    No friction rub.  Pulmonary:     Effort: No respiratory distress.     Breath sounds: No stridor. No wheezing or rhonchi.  Musculoskeletal:     Cervical back: No rigidity or tenderness.  Neurological:     Mental Status: He is alert.  Psychiatric:        Mood and Affect: Mood normal.       08/14/2021    3:00 PM  Results of the Epworth flowsheet  Sitting and reading 3  Watching TV 2  Sitting, inactive in a public place (e.g. a theatre or a meeting) 0  As a passenger in a car for an hour without a break 3  Lying down to rest in the afternoon when circumstances permit 3  Sitting and talking to someone 0  Sitting quietly after a lunch without alcohol 2  In a car, while stopped for a few minutes in traffic 0  Total score 13     Data Reviewed: Sleep  study from April 2021 shows moderate obstructive sleep apnea with mild oxygen desaturations  Titration study-titrated to BiPAP 12/8-9/25/2021 -Did not start BiPAP therapy  Retitration 09/24/2021 -Titrated to 15/11  Compliance data reviewed showing 81% compliance with BiPAP BiPAP set at 14/9 Residual AHI of 26.2  Titration 05/08/2022 -  14/10 with backup rate of 16  Residual AHI on current compliance is 14.4, 79% compliance  Assessment:  History of moderate obstructive sleep apnea Atrial fibrillation  Past COVID infection with complete recovery  Sleep quality is better  For his multiple awakenings, does not want to try any sleep aids  It is safe to continue BiPAP ST at present  Plan/Recommendations:  Continue BiPAP ST  If patient has any issues, the backup rate may be changed to a lower rate, appears to be tolerating the pressures well  Encouraged to continue to try and stay active  Follow-up in about 3 months  Scheduled for ablation in March  Sherrilyn Rist MD [facility] 09/07/2022, 8:38 AM  CC: Josetta Huddle, MD

## 2022-09-18 ENCOUNTER — Other Ambulatory Visit: Payer: Medicare Other

## 2022-10-02 ENCOUNTER — Ambulatory Visit: Payer: Medicare Other

## 2022-10-02 DIAGNOSIS — I495 Sick sinus syndrome: Secondary | ICD-10-CM | POA: Diagnosis not present

## 2022-10-02 LAB — CUP PACEART REMOTE DEVICE CHECK
Date Time Interrogation Session: 20240130074332
Implantable Lead Connection Status: 753985
Implantable Lead Connection Status: 753985
Implantable Lead Implant Date: 20181212
Implantable Lead Implant Date: 20181212
Implantable Lead Location: 753859
Implantable Lead Location: 753860
Implantable Lead Model: 377
Implantable Lead Model: 377
Implantable Lead Serial Number: 80523187
Implantable Lead Serial Number: 80573594
Implantable Pulse Generator Implant Date: 20181212
Pulse Gen Model: 407145
Pulse Gen Serial Number: 69192108

## 2022-10-03 ENCOUNTER — Other Ambulatory Visit (HOSPITAL_BASED_OUTPATIENT_CLINIC_OR_DEPARTMENT_OTHER): Payer: Medicare Other

## 2022-10-30 NOTE — Progress Notes (Signed)
Remote pacemaker transmission.   

## 2022-11-02 ENCOUNTER — Ambulatory Visit: Payer: Medicare Other | Attending: Cardiology

## 2022-11-02 DIAGNOSIS — Z01818 Encounter for other preprocedural examination: Secondary | ICD-10-CM

## 2022-11-02 DIAGNOSIS — Z79899 Other long term (current) drug therapy: Secondary | ICD-10-CM

## 2022-11-02 DIAGNOSIS — I484 Atypical atrial flutter: Secondary | ICD-10-CM

## 2022-11-02 DIAGNOSIS — I4819 Other persistent atrial fibrillation: Secondary | ICD-10-CM

## 2022-11-02 DIAGNOSIS — Z950 Presence of cardiac pacemaker: Secondary | ICD-10-CM

## 2022-11-02 DIAGNOSIS — I495 Sick sinus syndrome: Secondary | ICD-10-CM

## 2022-11-03 LAB — BASIC METABOLIC PANEL
BUN/Creatinine Ratio: 13 (ref 10–24)
BUN: 15 mg/dL (ref 8–27)
CO2: 23 mmol/L (ref 20–29)
Calcium: 9.4 mg/dL (ref 8.6–10.2)
Chloride: 100 mmol/L (ref 96–106)
Creatinine, Ser: 1.15 mg/dL (ref 0.76–1.27)
Glucose: 115 mg/dL — ABNORMAL HIGH (ref 70–99)
Potassium: 4.3 mmol/L (ref 3.5–5.2)
Sodium: 139 mmol/L (ref 134–144)
eGFR: 65 mL/min/{1.73_m2} (ref >59)

## 2022-11-03 LAB — CBC WITH DIFFERENTIAL/PLATELET
Basophils Absolute: 0.1 10*3/uL (ref 0.0–0.2)
Basos: 1 %
EOS (ABSOLUTE): 0.2 10*3/uL (ref 0.0–0.4)
Eos: 3 %
Hematocrit: 45.8 % (ref 37.5–51.0)
Hemoglobin: 15.9 g/dL (ref 13.0–17.7)
Immature Grans (Abs): 0 10*3/uL (ref 0.0–0.1)
Immature Granulocytes: 0 %
Lymphocytes Absolute: 1.7 10*3/uL (ref 0.7–3.1)
Lymphs: 23 %
MCH: 31.5 pg (ref 26.6–33.0)
MCHC: 34.7 g/dL (ref 31.5–35.7)
MCV: 91 fL (ref 79–97)
Monocytes Absolute: 0.9 10*3/uL (ref 0.1–0.9)
Monocytes: 12 %
Neutrophils Absolute: 4.5 10*3/uL (ref 1.4–7.0)
Neutrophils: 61 %
Platelets: 151 10*3/uL (ref 150–450)
RBC: 5.05 x10E6/uL (ref 4.14–5.80)
RDW: 12.5 % (ref 11.6–15.4)
WBC: 7.3 10*3/uL (ref 3.4–10.8)

## 2022-11-05 ENCOUNTER — Ambulatory Visit (HOSPITAL_COMMUNITY): Payer: Medicare Other | Admitting: Physician Assistant

## 2022-11-14 ENCOUNTER — Telehealth (HOSPITAL_COMMUNITY): Payer: Self-pay | Admitting: Emergency Medicine

## 2022-11-14 ENCOUNTER — Telehealth (HOSPITAL_COMMUNITY): Payer: Self-pay | Admitting: *Deleted

## 2022-11-14 NOTE — Telephone Encounter (Signed)
Reaching out to patient to offer assistance regarding upcoming cardiac imaging study; pt verbalizes understanding of appt date/time, parking situation and where to check in, pre-test NPO status and medications ordered, and verified current allergies; name and call back number provided for further questions should they arise Marchia Bond RN Navigator Cardiac Imaging [facility] [phone] office [phone] cell  [place] contrast Denies iv isuses Daily meds

## 2022-11-14 NOTE — Telephone Encounter (Signed)
Attempted to call patient regarding upcoming cardiac CT appointment. °Left message on voicemail with name and callback number ° °Quintrell Baze RN Navigator Cardiac Imaging °[facility] °[phone] Office °[phone] Cell ° °

## 2022-11-15 ENCOUNTER — Encounter (HOSPITAL_BASED_OUTPATIENT_CLINIC_OR_DEPARTMENT_OTHER): Payer: Self-pay

## 2022-11-15 ENCOUNTER — Ambulatory Visit (HOSPITAL_BASED_OUTPATIENT_CLINIC_OR_DEPARTMENT_OTHER)
Admission: RE | Admit: 2022-11-15 | Discharge: 2022-11-15 | Disposition: A | Discharge status: Home / Self Care | Payer: Medicare Other | Source: Ambulatory Visit | Attending: Cardiology | Admitting: Cardiology

## 2022-11-15 ENCOUNTER — Other Ambulatory Visit: Payer: Self-pay | Admitting: Cardiology

## 2022-11-15 DIAGNOSIS — I484 Atypical atrial flutter: Secondary | ICD-10-CM | POA: Diagnosis present

## 2022-11-15 DIAGNOSIS — Z950 Presence of cardiac pacemaker: Secondary | ICD-10-CM | POA: Diagnosis present

## 2022-11-15 DIAGNOSIS — I4819 Other persistent atrial fibrillation: Secondary | ICD-10-CM | POA: Insufficient documentation

## 2022-11-15 DIAGNOSIS — Z79899 Other long term (current) drug therapy: Secondary | ICD-10-CM | POA: Insufficient documentation

## 2022-11-15 DIAGNOSIS — Z01818 Encounter for other preprocedural examination: Secondary | ICD-10-CM | POA: Diagnosis present

## 2022-11-15 DIAGNOSIS — I480 Paroxysmal atrial fibrillation: Secondary | ICD-10-CM

## 2022-11-15 DIAGNOSIS — I495 Sick sinus syndrome: Secondary | ICD-10-CM | POA: Insufficient documentation

## 2022-11-15 MED ORDER — IOHEXOL 350 MG/ML SOLN
100.0000 mL | Freq: Once | INTRAVENOUS | Status: AC | PRN
  Administered 2022-11-15: 80 mL via INTRAVENOUS

## 2022-11-15 NOTE — Telephone Encounter (Signed)
Prescription refill request for Eliquis received. Indication: Afib  Last office visit: 07/05/22 Quentin Ore)  Scr: 1.15 (11/02/22)  Age: 82 Weight: 81.6kg  Appropriate dose. Refill sent.

## 2022-11-22 NOTE — Pre-Procedure Instructions (Signed)
Instructed patient on the following items: Arrival time 0830 Nothing to eat or drink after midnight No meds AM of procedure Responsible person to drive you home and stay with you for 24 hrs  Have you missed any doses of anti-coagulant Eliquis- taken twice daily, hasn't missed any doses.  Don't take in the morning.

## 2022-11-23 ENCOUNTER — Other Ambulatory Visit (HOSPITAL_COMMUNITY): Payer: Self-pay

## 2022-11-23 ENCOUNTER — Ambulatory Visit (HOSPITAL_BASED_OUTPATIENT_CLINIC_OR_DEPARTMENT_OTHER): Payer: Medicare Other | Admitting: Anesthesiology

## 2022-11-23 ENCOUNTER — Ambulatory Visit (HOSPITAL_COMMUNITY): Payer: Medicare Other | Admitting: Anesthesiology

## 2022-11-23 ENCOUNTER — Ambulatory Visit (HOSPITAL_COMMUNITY)
Admission: RE | Admit: 2022-11-23 | Discharge: 2022-11-23 | Disposition: A | Discharge status: Home / Self Care | Payer: Medicare Other | Attending: Cardiology | Admitting: Cardiology

## 2022-11-23 ENCOUNTER — Other Ambulatory Visit: Payer: Self-pay

## 2022-11-23 ENCOUNTER — Ambulatory Visit (HOSPITAL_COMMUNITY)
Admission: RE | Disposition: A | Discharge status: Home / Self Care | Payer: Self-pay | Source: Home / Self Care | Attending: Cardiology

## 2022-11-23 DIAGNOSIS — I10 Essential (primary) hypertension: Secondary | ICD-10-CM | POA: Diagnosis not present

## 2022-11-23 DIAGNOSIS — Z7901 Long term (current) use of anticoagulants: Secondary | ICD-10-CM | POA: Insufficient documentation

## 2022-11-23 DIAGNOSIS — I4891 Unspecified atrial fibrillation: Secondary | ICD-10-CM | POA: Diagnosis not present

## 2022-11-23 DIAGNOSIS — I4819 Other persistent atrial fibrillation: Secondary | ICD-10-CM | POA: Insufficient documentation

## 2022-11-23 DIAGNOSIS — I495 Sick sinus syndrome: Secondary | ICD-10-CM | POA: Diagnosis not present

## 2022-11-23 DIAGNOSIS — G4733 Obstructive sleep apnea (adult) (pediatric): Secondary | ICD-10-CM | POA: Diagnosis not present

## 2022-11-23 DIAGNOSIS — Z9989 Dependence on other enabling machines and devices: Secondary | ICD-10-CM | POA: Diagnosis not present

## 2022-11-23 DIAGNOSIS — Z8249 Family history of ischemic heart disease and other diseases of the circulatory system: Secondary | ICD-10-CM | POA: Insufficient documentation

## 2022-11-23 DIAGNOSIS — I484 Atypical atrial flutter: Secondary | ICD-10-CM | POA: Insufficient documentation

## 2022-11-23 DIAGNOSIS — G4730 Sleep apnea, unspecified: Secondary | ICD-10-CM | POA: Diagnosis not present

## 2022-11-23 DIAGNOSIS — Z7722 Contact with and (suspected) exposure to environmental tobacco smoke (acute) (chronic): Secondary | ICD-10-CM | POA: Diagnosis not present

## 2022-11-23 DIAGNOSIS — E039 Hypothyroidism, unspecified: Secondary | ICD-10-CM | POA: Diagnosis not present

## 2022-11-23 DIAGNOSIS — Z950 Presence of cardiac pacemaker: Secondary | ICD-10-CM | POA: Insufficient documentation

## 2022-11-23 HISTORY — PX: ATRIAL FIBRILLATION ABLATION: EP1191

## 2022-11-23 SURGERY — ATRIAL FIBRILLATION ABLATION
Anesthesia: General

## 2022-11-23 MED ORDER — PHENYLEPHRINE HCL-NACL 20-0.9 MG/250ML-% IV SOLN
INTRAVENOUS | Status: DC | PRN
  Administered 2022-11-23: 50 ug/min via INTRAVENOUS
  Administered 2022-11-23: 160 ug via INTRAVENOUS

## 2022-11-23 MED ORDER — HEPARIN SODIUM (PORCINE) 1000 UNIT/ML IJ SOLN
INTRAMUSCULAR | Status: DC | PRN
  Administered 2022-11-23: 1000 [IU] via INTRAVENOUS

## 2022-11-23 MED ORDER — PROTAMINE SULFATE 10 MG/ML IV SOLN
INTRAVENOUS | Status: DC | PRN
  Administered 2022-11-23: 35 mg via INTRAVENOUS

## 2022-11-23 MED ORDER — HEPARIN (PORCINE) IN NACL 1000-0.9 UT/500ML-% IV SOLN
INTRAVENOUS | Status: DC | PRN
  Administered 2022-11-23 (×3): 500 mL

## 2022-11-23 MED ORDER — APIXABAN 5 MG PO TABS
5.0000 mg | ORAL_TABLET | Freq: Two times a day (BID) | ORAL | Status: DC
  Administered 2022-11-23: 5 mg via ORAL
  Filled 2022-11-23: qty 1

## 2022-11-23 MED ORDER — SODIUM CHLORIDE 0.9% FLUSH: 3.0000 mL | Freq: Two times a day (BID) | INTRAVENOUS | Status: DC

## 2022-11-23 MED ORDER — PANTOPRAZOLE SODIUM 40 MG PO TBEC
40.0000 mg | DELAYED_RELEASE_TABLET | Freq: Every day | ORAL | Status: DC
  Administered 2022-11-23: 40 mg via ORAL
  Filled 2022-11-23: qty 1

## 2022-11-23 MED ORDER — PANTOPRAZOLE SODIUM 40 MG PO TBEC
40.0000 mg | DELAYED_RELEASE_TABLET | Freq: Every day | ORAL | 0 refills | Status: DC
  Filled 2022-11-23: qty 45, 45d supply, fill #0

## 2022-11-23 MED ORDER — ONDANSETRON HCL 4 MG/2ML IJ SOLN
INTRAMUSCULAR | Status: DC | PRN
  Administered 2022-11-23: 4 mg via INTRAVENOUS

## 2022-11-23 MED ORDER — PROPOFOL 10 MG/ML IV BOLUS
INTRAVENOUS | Status: DC | PRN
  Administered 2022-11-23: 130 mg via INTRAVENOUS

## 2022-11-23 MED ORDER — COLCHICINE 0.6 MG PO TABS
0.6000 mg | ORAL_TABLET | Freq: Two times a day (BID) | ORAL | 0 refills | Status: DC
  Filled 2022-11-23: qty 10, 5d supply, fill #0

## 2022-11-23 MED ORDER — SODIUM CHLORIDE 0.9 % IV SOLN: 250.0000 mL | INTRAVENOUS | Status: DC | PRN

## 2022-11-23 MED ORDER — LIDOCAINE 2% (20 MG/ML) 5 ML SYRINGE
INTRAMUSCULAR | Status: DC | PRN
  Administered 2022-11-23: 80 mg via INTRAVENOUS

## 2022-11-23 MED ORDER — HEPARIN SODIUM (PORCINE) 1000 UNIT/ML IJ SOLN
INTRAMUSCULAR | Status: AC
  Filled 2022-11-23: qty 10

## 2022-11-23 MED ORDER — ACETAMINOPHEN 325 MG PO TABS: 650.0000 mg | ORAL_TABLET | ORAL | Status: DC | PRN

## 2022-11-23 MED ORDER — COLCHICINE 0.6 MG PO TABS
0.6000 mg | ORAL_TABLET | Freq: Two times a day (BID) | ORAL | Status: DC
  Administered 2022-11-23: 0.6 mg via ORAL
  Filled 2022-11-23: qty 1

## 2022-11-23 MED ORDER — DEXAMETHASONE SODIUM PHOSPHATE 10 MG/ML IJ SOLN
INTRAMUSCULAR | Status: DC | PRN
  Administered 2022-11-23: 5 mg via INTRAVENOUS

## 2022-11-23 MED ORDER — PHENYLEPHRINE 80 MCG/ML (10ML) SYRINGE FOR IV PUSH (FOR BLOOD PRESSURE SUPPORT)
PREFILLED_SYRINGE | INTRAVENOUS | Status: DC | PRN
  Administered 2022-11-23: 120 ug via INTRAVENOUS

## 2022-11-23 MED ORDER — CEFAZOLIN SODIUM-DEXTROSE 2-3 GM-%(50ML) IV SOLR
INTRAVENOUS | Status: DC | PRN
  Administered 2022-11-23: 2 g via INTRAVENOUS

## 2022-11-23 MED ORDER — ROCURONIUM BROMIDE 10 MG/ML (PF) SYRINGE
PREFILLED_SYRINGE | INTRAVENOUS | Status: DC | PRN
  Administered 2022-11-23: 60 mg via INTRAVENOUS

## 2022-11-23 MED ORDER — FENTANYL CITRATE (PF) 100 MCG/2ML IJ SOLN
INTRAMUSCULAR | Status: DC | PRN
  Administered 2022-11-23 (×2): 50 ug via INTRAVENOUS

## 2022-11-23 MED ORDER — ONDANSETRON HCL 4 MG/2ML IJ SOLN: 4.0000 mg | Freq: Four times a day (QID) | INTRAMUSCULAR | Status: DC | PRN

## 2022-11-23 MED ORDER — SODIUM CHLORIDE 0.9% FLUSH: 3.0000 mL | INTRAVENOUS | Status: DC | PRN

## 2022-11-23 MED ORDER — SUGAMMADEX SODIUM 200 MG/2ML IV SOLN
INTRAVENOUS | Status: DC | PRN
  Administered 2022-11-23: 200 mg via INTRAVENOUS

## 2022-11-23 MED ORDER — CEFAZOLIN SODIUM-DEXTROSE 2-4 GM/100ML-% IV SOLN
INTRAVENOUS | Status: AC
  Filled 2022-11-23: qty 100

## 2022-11-23 MED ORDER — ACETAMINOPHEN 500 MG PO TABS
1000.0000 mg | ORAL_TABLET | Freq: Once | ORAL | Status: AC
  Administered 2022-11-23: 1000 mg via ORAL
  Filled 2022-11-23: qty 2

## 2022-11-23 MED ORDER — HEPARIN SODIUM (PORCINE) 1000 UNIT/ML IJ SOLN
INTRAMUSCULAR | Status: DC | PRN
  Administered 2022-11-23: 13000 [IU] via INTRAVENOUS

## 2022-11-23 MED ORDER — SODIUM CHLORIDE 0.9 % IV SOLN: INTRAVENOUS | Status: DC

## 2022-11-23 SURGICAL SUPPLY — 18 items
BAG SNAP BAND KOVER 36X36 (MISCELLANEOUS) IMPLANT
CATH ABLAT QDOT MICRO BI TC FJ (CATHETERS) IMPLANT
CATH GE 8FR SOUNDSTAR (CATHETERS) IMPLANT
CATH OCTARAY 2.0 F 3-3-3-3-3 (CATHETERS) IMPLANT
CATH S-M CIRCA TEMP PROBE (CATHETERS) IMPLANT
CATH WEBSTER BI DIR CS D-F CRV (CATHETERS) IMPLANT
CLOSURE PERCLOSE PROSTYLE (VASCULAR PRODUCTS) IMPLANT
COVER SWIFTLINK CONNECTOR (BAG) ×1 IMPLANT
PACK EP LATEX FREE (CUSTOM PROCEDURE TRAY) ×1
PACK EP LF (CUSTOM PROCEDURE TRAY) ×1 IMPLANT
PAD DEFIB RADIO PHYSIO CONN (PAD) ×1 IMPLANT
PATCH CARTO3 (PAD) IMPLANT
SHEATH BAYLIS TRANSSEPTAL 98CM (NEEDLE) IMPLANT
SHEATH CARTO VIZIGO SM CVD (SHEATH) IMPLANT
SHEATH PINNACLE 8F 10CM (SHEATH) IMPLANT
SHEATH PINNACLE 9F 10CM (SHEATH) IMPLANT
SHEATH PROBE COVER 6X72 (BAG) IMPLANT
TUBING SMART ABLATE COOLFLOW (TUBING) IMPLANT

## 2022-11-23 NOTE — Anesthesia Preprocedure Evaluation (Addendum)
Anesthesia Evaluation  Patient identified by MRN, date of birth, ID band Patient awake    Reviewed: Allergy & Precautions, NPO status , Patient's Chart, lab work & pertinent test results, reviewed documented beta blocker date and time   Airway Mallampati: II  TM Distance: <3 FB Neck ROM: Full    Dental  (+) Teeth Intact, Dental Advisory Given   Pulmonary sleep apnea and Continuous Positive Airway Pressure Ventilation    Pulmonary exam normal breath sounds clear to auscultation       Cardiovascular hypertension, Pt. on home beta blockers and Pt. on medications Normal cardiovascular exam+ dysrhythmias Atrial Fibrillation + pacemaker  Rhythm:Regular Rate:Normal     Neuro/Psych negative neurological ROS  negative psych ROS   GI/Hepatic Neg liver ROS,,,Partial colon resection    Endo/Other  Hypothyroidism    Renal/GU negative Renal ROS     Musculoskeletal negative musculoskeletal ROS (+)    Abdominal   Peds  Hematology negative hematology ROS (+)   Anesthesia Other Findings Day of surgery medications reviewed with the patient.  Reproductive/Obstetrics                             Anesthesia Physical Anesthesia Plan  ASA: 3  Anesthesia Plan: General   Post-op Pain Management: Tylenol PO (pre-op)*   Induction: Intravenous  PONV Risk Score and Plan: 2 and Dexamethasone and Ondansetron  Airway Management Planned: Oral ETT  Additional Equipment:   Intra-op Plan:   Post-operative Plan: Extubation in OR  Informed Consent: I have reviewed the patients History and Physical, chart, labs and discussed the procedure including the risks, benefits and alternatives for the proposed anesthesia with the patient or authorized representative who has indicated his/her understanding and acceptance.     Dental advisory given  Plan Discussed with: CRNA  Anesthesia Plan Comments:         Anesthesia Quick Evaluation

## 2022-11-23 NOTE — Anesthesia Procedure Notes (Signed)
Procedure Name: Intubation Date/Time: 11/23/2022 10:55 AM  Performed by: Elvin So, CRNAPre-anesthesia Checklist: Patient identified, Emergency Drugs available, Suction available and Patient being monitored Patient Re-evaluated:Patient Re-evaluated prior to induction Oxygen Delivery Method: Circle System Utilized Preoxygenation: Pre-oxygenation with 100% oxygen Induction Type: IV induction Ventilation: Mask ventilation without difficulty Laryngoscope Size: Mac and 4 Grade View: Grade II Tube type: Oral Tube size: 7.5 mm Number of attempts: 1 Airway Equipment and Method: Stylet and Oral airway Placement Confirmation: ETT inserted through vocal cords under direct vision, positive ETCO2 and breath sounds checked- equal and bilateral Secured at: 24 cm Tube secured with: Tape Dental Injury: Teeth and Oropharynx as per pre-operative assessment

## 2022-11-23 NOTE — Discharge Instructions (Signed)
Post procedure care instructions No driving for 4 days. No lifting over 5 lbs for 1 week. No vigorous or sexual activity for 1 week. You may return to work/your usual activities on 12/01/22. Keep procedure site clean & dry. If you notice increased pain, swelling, bleeding or pus, call/return!  You may shower after 24 hours, but no soaking in baths/hot tubs/pools for 1 week.    You have an appointment set up with the [facility].  Multiple studies have shown that being followed by a dedicated atrial fibrillation clinic in addition to the standard care you receive from your other physicians improves health. We believe that enrollment in the atrial fibrillation clinic will allow [place] to better care for you.   The phone number to the [facility] is [phone]. The clinic is staffed Monday through Friday from 8:30am to 5pm.  Directions: The clinic is located in the [facility] [place] the hospital at the MAIN ENTRANCE "A", use [address] to the 6th floor.  Registration desk to the right of elevators on 6th floor  If you have any trouble locating the clinic, please don't hesitate to call [phone].

## 2022-11-23 NOTE — H&P (Signed)
Electrophysiology Office Follow up Visit Note:     Date:  11/23/2022    ID:  Nouri Brusky, DOB 11/25/1940, MRN [id number]   PCP:  Josetta Huddle, MD        [facility] Cardiologist:  Minus Breeding, MD  [facility] Electrophysiologist:  Vickie Epley, MD      Interval History:     Antonio Curtis is a 82 y.o. male who presents for a follow up visit. They were last seen in clinic by [facility] 05/18/2022 and by me 04/18/2022.    From my most recent note:   "He has a history of sick sinus syndrome post permanent pacemaker implant in 2018.  The patient also has a history of atrial fibrillation post ablation in 2021.  The patient was last seen by Oda Kilts Jan 10, 2022.  His burden of atrial fibrillation was 38% at that appointment.  He is maintained on Tikosyn 250 mcg by mouth twice daily in addition to Eliquis 5 mg by mouth twice daily.  He also takes metoprolol tartrate twice daily.   The patient complains of fatigue and shortness of breath on days when he has afib. He has had these symptoms the past 2 days, but no longer has them today. These symptoms occur once to twice a week on average. Once the symptoms start, he is "done for the day." He is overall functioning well despite these episodes, and he is not to the point where his symptoms are controlling his life. "   When he met with [facility], he expressed an interest in moving forward with a repeat ablation. Today he presents to discuss further.   Today, he confirms having episodes of irregular heart beats intermittently. Most recently he had an episode about 1 hour ago.   He denies any chest pain, shortness of breath, or peripheral edema. No lightheadedness, headaches, syncope, orthopnea, or PND.   Presents for PVI today.      Objective      Past Medical History:  Diagnosis Date   H/O partial resection of colon     Hypothyroid     Pacemaker      Biotronik Edora 8 DR-T   Paroxysmal atrial fibrillation (HCC)     Sick sinus  syndrome ([place])             Past Surgical History:  Procedure Laterality Date   ACHILLES TENDON REPAIR       APPENDECTOMY       ATRIAL FIBRILLATION ABLATION N/A 05/20/2020    Procedure: ATRIAL FIBRILLATION ABLATION;  Surgeon: Thompson Grayer, MD;  Location: [facility];  Service: Cardiovascular;  Laterality: N/A;   ATRIAL FIBRILLATION ABLATION N/A 06/29/2021    Procedure: ATRIAL FIBRILLATION ABLATION;  Surgeon: Thompson Grayer, MD;  Location: [facility];  Service: Cardiovascular;  Laterality: N/A;   COLON SURGERY        Hemicolectomy:  Polyp.     PACEMAKER IMPLANT        Biotronik PPM implanted in [place] by Dr Joelene Millin.   TONSILLECTOMY          Current Medications: Active Medications      Current Meds  Medication Sig   Cholecalciferol (VITAMIN D3 PO) Take 1,000 Units by mouth daily.   dofetilide (TIKOSYN) 250 MCG capsule Take 1 capsule (250 mcg total) by mouth 2 (two) times daily.   ELIQUIS 5 MG TABS tablet TAKE 1 TABLET BY MOUTH TWICE A DAY   levothyroxine (SYNTHROID) 88 MCG tablet Take  88 mcg by mouth at bedtime.   Metoprolol Tartrate 37.5 MG TABS TAKE 1 TABALET BY MOUTH 2 (TWO) TIMES DAILY.        Allergies:   Patient has no known allergies.    Social History         Socioeconomic History   Marital status: Married      Spouse name: Not on file   Number of children: Not on file   Years of education: Not on file   Highest education level: Not on file  Occupational History   Not on file  Tobacco Use   Smoking status: Never      Passive exposure: Past   Smokeless tobacco: Never   Tobacco comments:      Never smoke 11/21/21  Vaping Use   Vaping Use: Never used  Substance and Sexual Activity   Alcohol use: Yes      Alcohol/week: 1.0 - 2.0 standard drink of alcohol      Types: 1 - 2 Glasses of wine per week      Comment: monthly   Drug use: Never   Sexual activity: Not on file  Other Topics Concern   Not on file  Social History Narrative    Five  children.  [place]  Retired Pharmacist, community.    Lives in [place] with spouse.    Social Determinants of Health    Financial Resource Strain: Not on file  Food Insecurity: Not on file  Transportation Needs: Not on file  Physical Activity: Not on file  Stress: Not on file  Social Connections: Not on file      Family History: The patient's family history includes Diabetes in his father; Heart disease (age of onset: 57) in his father.   ROS:   Please see the history of present illness.    (+) Palpitations All other systems reviewed and are negative.   EKGs/Labs/Other Studies Reviewed:     The following studies were reviewed today:      ECG today shows ApVs. QTc 430ms.   07/05/2022 in clinic device interrogation personally reviewed DDD CLS 60-130 Battery longevity 5y20m Lead parameters stable 60% A paced 9% Vpaced 16% AF burden     Recent Labs: 04/18/2022: BUN 14; Creatinine, Ser 1.06; Magnesium 2.3; Potassium 4.1; Sodium 137    Recent Lipid Panel Labs (Brief)  No results found for: "CHOL", "TRIG", "HDL", "CHOLHDL", "VLDL", "LDLCALC", "LDLDIRECT"     Physical Exam:     VS:  BP 115/99   Pulse 72   Ht 6' (1.829 m)   Wt 180 lb (81.6 kg)   SpO2 97%   BMI 24.41 kg/m         Wt Readings from Last 3 Encounters:  07/05/22 180 lb (81.6 kg)  05/18/22 179 lb (81.2 kg)  05/08/22 179 lb (81.2 kg)      GEN: Well nourished, well developed in no acute distress HEENT: Normal NECK: No JVD; No carotid bruits LYMPHATICS: No lymphadenopathy CARDIAC: RRR, no murmurs, rubs, gallops. PPM pocket well healed. RESPIRATORY:  Clear to auscultation without rales, wheezing or rhonchi  ABDOMEN: Soft, non-tender, non-distended MUSCULOSKELETAL:  No edema; No deformity  SKIN: Warm and dry NEUROLOGIC:  Alert and oriented x 3 PSYCHIATRIC:  Normal affect          Assessment ASSESSMENT:     1. Persistent atrial fibrillation ([place])   2. Sinus node dysfunction (HCC)   3. 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 Pacemaker  5. Encounter for long-term (current) use of high-risk medication     PLAN:     In order of problems listed above:   #Persistent AF #Atypical AFL #Dofetilide monitoring   Recurrent. Symptomatic. Most recent ablation 06/29/2022 by JA.   Discussed treatment options today for their AF including antiarrhythmic drug therapy and ablation. Discussed risks, recovery and likelihood of success. Discussed potential need for repeat ablation procedures and antiarrhythmic drugs after an initial ablation. They wish to proceed with scheduling.   Risk, benefits, and alternatives to EP study and radiofrequency ablation for afib were also discussed in detail today. These risks include but are not limited to stroke, bleeding, vascular damage, tamponade, perforation, damage to the esophagus, lungs, and other structures, pulmonary vein stenosis, worsening renal function, and death. The patient understands these risk and wishes to proceed.  We will therefore proceed with catheter ablation at the next available time.  Carto, ICE, anesthesia are requested for the procedure.  Will also obtain CT PV protocol prior to the procedure to exclude LAA thrombus and further evaluate atrial anatomy.         #SND #PPM in situ Device functioning appropriately. Continue remote monitoring.     Presents for redo PVI today. Procedure reviewed.  Signed, Lars Mage, MD, [facility], [facility] 11/23/2022   Electrophysiology [facility]

## 2022-11-23 NOTE — Transfer of Care (Signed)
Immediate Anesthesia Transfer of Care Note  Patient: Antonio Curtis  Procedure(s) Performed: ATRIAL FIBRILLATION ABLATION  Patient Location: PACU  Anesthesia Type:General  Level of Consciousness: awake and patient cooperative  Airway & Oxygen Therapy: Patient Spontanous Breathing and Patient connected to nasal cannula oxygen  Post-op Assessment: Report given to RN, Post -op Vital signs reviewed and stable, and Patient moving all extremities  Post vital signs: Reviewed and stable  Last Vitals:  Vitals Value Taken Time  BP 111/66 11/23/22 1251  Temp    Pulse 75 11/23/22 1254  Resp 0 11/23/22 1254  SpO2 98 % 11/23/22 1254  Vitals shown include unvalidated device data.  Last Pain:  Vitals:   11/23/22 0910  TempSrc: Temporal  PainSc: 0-No pain         Complications: There were no known notable events for this encounter.

## 2022-11-23 NOTE — Anesthesia Postprocedure Evaluation (Signed)
Anesthesia Post Note  Patient: Antonio Curtis  Procedure(s) Performed: ATRIAL FIBRILLATION ABLATION     Patient location during evaluation: Cath Lab Anesthesia Type: General Level of consciousness: awake and alert Pain management: pain level controlled Vital Signs Assessment: post-procedure vital signs reviewed and stable Respiratory status: spontaneous breathing, nonlabored ventilation, respiratory function stable and patient connected to nasal cannula oxygen Cardiovascular status: blood pressure returned to baseline and stable Postop Assessment: no apparent nausea or vomiting Anesthetic complications: no   There were no known notable events for this encounter.  Last Vitals:  Vitals:   11/23/22 1405 11/23/22 1415  BP: 111/61 (!) 115/59  Pulse: 77 75  Resp: (!) 0 (!) 0  Temp:    SpO2: 94% 94%    Last Pain:  Vitals:   11/23/22 1345  TempSrc:   PainSc: 0-No pain                 Santa Lighter

## 2022-11-26 ENCOUNTER — Encounter (HOSPITAL_COMMUNITY): Payer: Self-pay | Admitting: Cardiology

## 2022-11-26 LAB — POCT ACTIVATED CLOTTING TIME: Activated Clotting Time: 347 seconds

## 2022-11-27 MED FILL — Lidocaine HCl Local Preservative Free (PF) Inj 1%: INTRAMUSCULAR | Qty: 30 | Status: AC

## 2022-11-27 MED FILL — Cefazolin Sodium For Inj 2 GM: INTRAMUSCULAR | Qty: 2 | Status: AC

## 2022-12-31 LAB — CUP PACEART REMOTE DEVICE CHECK
Battery Remaining Percentage: 55 %
Brady Statistic RA Percent Paced: 94 %
Brady Statistic RV Percent Paced: 1 %
Date Time Interrogation Session: 20240429160109
Implantable Lead Connection Status: 753985
Implantable Lead Connection Status: 753985
Implantable Lead Implant Date: 20181212
Implantable Lead Implant Date: 20181212
Implantable Lead Location: 753859
Implantable Lead Location: 753860
Implantable Lead Model: 377
Implantable Lead Model: 377
Implantable Lead Serial Number: 80523187
Implantable Lead Serial Number: 80573594
Implantable Pulse Generator Implant Date: 20181212
Lead Channel Impedance Value: 546 Ohm
Lead Channel Impedance Value: 702 Ohm
Lead Channel Sensing Intrinsic Amplitude: 16.9 mV
Lead Channel Sensing Intrinsic Amplitude: 4.5 mV
Lead Channel Setting Pacing Amplitude: 2 V
Lead Channel Setting Pacing Amplitude: 2.4 V
Lead Channel Setting Pacing Pulse Width: 0.4 ms
Pulse Gen Model: 407145
Pulse Gen Serial Number: 69192108

## 2023-01-01 ENCOUNTER — Ambulatory Visit (INDEPENDENT_AMBULATORY_CARE_PROVIDER_SITE_OTHER): Payer: Medicare Other

## 2023-01-01 DIAGNOSIS — I495 Sick sinus syndrome: Secondary | ICD-10-CM | POA: Diagnosis not present

## 2023-01-21 ENCOUNTER — Ambulatory Visit (HOSPITAL_COMMUNITY): Payer: Medicare Other | Admitting: Physician Assistant

## 2023-01-23 NOTE — Progress Notes (Signed)
Remote pacemaker transmission.   

## 2023-01-25 ENCOUNTER — Ambulatory Visit (HOSPITAL_COMMUNITY)
Admission: RE | Admit: 2023-01-25 | Discharge: 2023-01-25 | Disposition: A | Discharge status: Home / Self Care | Payer: Medicare Other | Source: Ambulatory Visit | Attending: Physician Assistant | Admitting: Physician Assistant

## 2023-01-25 VITALS — BP 130/70 | HR 71 | Ht 72.0 in | Wt 180.4 lb

## 2023-01-25 DIAGNOSIS — D6869 Other thrombophilia: Secondary | ICD-10-CM | POA: Diagnosis not present

## 2023-01-25 DIAGNOSIS — Z79899 Other long term (current) drug therapy: Secondary | ICD-10-CM | POA: Diagnosis not present

## 2023-01-25 DIAGNOSIS — D6859 Other primary thrombophilia: Secondary | ICD-10-CM | POA: Insufficient documentation

## 2023-01-25 DIAGNOSIS — Z87891 Personal history of nicotine dependence: Secondary | ICD-10-CM | POA: Insufficient documentation

## 2023-01-25 DIAGNOSIS — Z5181 Encounter for therapeutic drug level monitoring: Secondary | ICD-10-CM | POA: Diagnosis not present

## 2023-01-25 DIAGNOSIS — I495 Sick sinus syndrome: Secondary | ICD-10-CM | POA: Diagnosis not present

## 2023-01-25 DIAGNOSIS — I4892 Unspecified atrial flutter: Secondary | ICD-10-CM | POA: Insufficient documentation

## 2023-01-25 DIAGNOSIS — G4733 Obstructive sleep apnea (adult) (pediatric): Secondary | ICD-10-CM | POA: Diagnosis not present

## 2023-01-25 DIAGNOSIS — I4819 Other persistent atrial fibrillation: Secondary | ICD-10-CM | POA: Diagnosis present

## 2023-01-25 LAB — MAGNESIUM: Magnesium: 2.4 mg/dL (ref 1.7–2.4)

## 2023-01-25 NOTE — Progress Notes (Signed)
Primary Care Physician: [facility] Primary Cardiologist: Dr Antoine Poche  Primary Electrophysiologist: Dr Lalla Brothers Referring Physician: Dr Johney Frame   Antonio Curtis is a 82 y.o. male with a history of SSS s/p PPM, hypothyroid, OSA, and paroxysmal atrial fibrillation who presents for follow up in the [facility]. He reports initially being diagnosed with atrial fibrillation in 2016 after presenting to the ER with numbness down his L side (face, arm and leg) numbness.  He was noted to have afib at the time.  He was also found to be dehydrated at that time. He was placed on flecainide and eliquis.  He did well initially.  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  He had Biotronik PPM implanted by Dr [facility] in [place].  He moved to [facility] 07/2019 and has been followed by Dr Graciela Husbands since that time.  Unfortunately, he began having increasing frequency and duration of atrial fibrillation.  Flecainide was switched to rhythmol, with some improvement in his afib. Not felt to be a good candidate for sotalol or dofetilide with QT prolongation. He is now s/p afib ablation with Dr Johney Frame on 05/20/20. Patient is on Eliquis for a CHADS2VASC score of 2.   Patient is s/p repeat afib ablation with Dr Johney Frame on 06/29/21 but unfortunately continued to have a high burden of afib. S/p dofetilide admission 3/21-3/24/23. He converted with the medication and did not require DCCV. Unfortunately, he had an increase in his afib burden and had another ablation with Dr Lalla Brothers on 11/23/22.  On follow up today, patient reports that he has done very well since his ablation with no symptoms of afib. He is A paced today. He denies chest pain, swallowing pain, or groin issues.  Today, he denies symptoms of palpitations, chest pain, orthopnea, PND, lower extremity edema, dizziness, presyncope, syncope, bleeding, or  neurologic sequela. The patient is tolerating medications without difficulties and is otherwise without complaint today.    Atrial Fibrillation Risk Factors:  he does have symptoms or diagnosis of sleep apnea. he is compliant with BiPAP therapy. he does not have a history of rheumatic fever.   he has a BMI of Body mass index is 24.47 kg/m.Marland Kitchen Filed Weights   01/25/23 0947  Weight: 81.8 kg    Family History  Problem Relation Age of Onset   Heart disease Father 37   Diabetes Father      Atrial Fibrillation Management history:  Previous antiarrhythmic drugs: flecainide, propafenone, dofetilide   Previous cardioversions: none Previous ablations: 05/20/20, 06/29/21, 11/23/22 CHADS2VASC score: 2 Anticoagulation history: Eliquis   Past Medical History:  Diagnosis Date   H/O partial resection of colon    Hypothyroid    Pacemaker    Biotronik Edora 8 DR-T   Paroxysmal atrial fibrillation (HCC)    Sick sinus syndrome (HCC)    Past Surgical History:  Procedure Laterality Date   ACHILLES TENDON REPAIR     APPENDECTOMY     ATRIAL FIBRILLATION ABLATION N/A 05/20/2020   Procedure: ATRIAL FIBRILLATION ABLATION;  Surgeon: [name], MD;  Location: [facility];  Service: Cardiovascular;  Laterality: N/A;   ATRIAL FIBRILLATION ABLATION N/A 06/29/2021   Procedure: ATRIAL FIBRILLATION ABLATION;  Surgeon: [name], MD;  Location: [facility] INVASIVE CV LAB;  Service: Cardiovascular;  Laterality: N/A;   ATRIAL FIBRILLATION ABLATION N/A 11/23/2022   Procedure: ATRIAL FIBRILLATION ABLATION;  Surgeon: Lanier Prude, MD;  Location:  [facility]VAS[facility] CV LAB;  Service: Cardiovascular;  Laterality: N/A;   COLON SURGERY     Hemicolectomy:  Polyp.     PACEMAKER IMPLANT     Biotronik PPM implanted in [place] by Dr [name].   TONSILLECTOMY      Current Outpatient Medications  Medication Sig Dispense Refill   acetaminophen (TYLENOL) 500 MG tablet Take 1,000 mg by mouth as needed for  moderate pain.     Cholecalciferol (VITAMIN D) 50 MCG (2000 UT) tablet Take 2,000 Units by mouth daily.     cyanocobalamin 2000 MCG tablet Take 2,000 mcg by mouth daily.     docusate sodium (COLACE) 50 MG capsule Take 50 mg by mouth daily.     dofetilide (TIKOSYN) 250 MCG capsule Take 1 capsule (250 mcg total) by mouth 2 (two) times daily. 180 capsule 3   ELIQUIS 5 MG TABS tablet TAKE 1 TABLET BY MOUTH TWICE A DAY 180 tablet 1   levothyroxine (SYNTHROID) 88 MCG tablet Take 88 mcg by mouth at bedtime.     Metoprolol Tartrate 37.5 MG TABS TAKE 1 TABALET BY MOUTH 2 (TWO) TIMES DAILY. 180 tablet 3   No current facility-administered medications for this encounter.    No Known Allergies  Social History   Socioeconomic History   Marital status: Married    Spouse name: Not on file   Number of children: Not on file   Years of education: Not on file   Highest education level: Not on file  Occupational History   Not on file  Tobacco Use   Smoking status: Never    Passive exposure: Past   Smokeless tobacco: Never   Tobacco comments:    Never smoke 11/21/21  Vaping Use   Vaping Use: Never used  Substance and Sexual Activity   Alcohol use: Yes    Alcohol/week: 1.0 - 2.0 standard drink of alcohol    Types: 1 - 2 Glasses of wine per week    Comment: monthly   Drug use: Never   Sexual activity: Not on file  Other Topics Concern   Not on file  Social History Narrative   Five children.  7 Grand.  Retired Education officer, community.   Lives in [place] with spouse.   Social Determinants of Health   Financial Resource Strain: Not on file  Food Insecurity: Not on file  Transportation Needs: Not on file  Physical Activity: Not on file  Stress: Not on file  Social Connections: Not on file  Intimate Partner Violence: Not on file     ROS- All systems are reviewed and negative except as per the HPI above.  Physical Exam: Vitals:   01/25/23 0947  BP: 130/70  Pulse: 71  Weight: 81.8 kg  Height:  6' (1.829 m)    GEN- The patient is a well appearing elderly male, alert and oriented x 3 today.   HEENT-head normocephalic, atraumatic, sclera clear, conjunctiva pink, hearing intact, trachea midline. Lungs- Clear to ausculation bilaterally, normal work of breathing Heart- Regular rate and rhythm, no murmurs, rubs or gallops  GI- soft, NT, ND, + BS Extremities- no clubbing, cyanosis, or edema MS- no significant deformity or atrophy Skin- no rash or lesion Psych- euthymic mood, full affect Neuro- strength and sensation are intact   Wt Readings from Last 3 Encounters:  01/25/23 81.8 kg  11/23/22 81.2 kg  09/07/22 81.6 kg    EKG today demonstrates  A paced rhythm Vent. rate 71 BPM PR interval 242 ms QRS duration  96 ms QT/QTcB 388/421 ms  Echo 04/20/20 demonstrated  1. Left ventricular ejection fraction, by estimation, is 60 to 65%. The  left ventricle has normal function. The left ventricle has no regional  wall motion abnormalities. Left ventricular diastolic parameters are  indeterminate.   2. Right ventricular systolic function is normal. The right ventricular  size is normal. There is normal pulmonary artery systolic pressure.   3. Left atrial size was mildly dilated.   4. The mitral valve is normal in structure. Trivial mitral valve  regurgitation. No evidence of mitral stenosis.   5. The aortic valve is tricuspid. Aortic valve regurgitation is not  visualized. No aortic stenosis is present.   6. Aortic dilatation noted. Aneurysm of the ascending aorta, measuring 37 mm. There is mild dilatation at the level of the sinuses of Valsalva  measuring 38 mm.   7. The inferior vena cava is normal in size with greater than 50%  respiratory variability, suggesting right atrial pressure of 3 mmHg.   Epic records are reviewed at length today  CHA2DS2-VASc Score = 2  The patient's score is based upon: CHF History: 0 HTN History: 0 Diabetes History: 0 Stroke History:  0 Vascular Disease History: 0 Age Score: 2 Gender Score: 0        ASSESSMENT AND PLAN: 1. Persistent Atrial Fibrillation/atrial flutter The patient's CHA2DS2-VASc score is 2, indicating a 2.2% annual risk of stroke.   S/p afib ablation with Dr Johney Frame on 05/20/20 with repeat ablation 06/29/21 and 11/23/22. S/p dofetilide admission 3/21-3/24/23 Patient appears to be maintaining SR.  Continue dofetilide 250 mcg BID. QT stable. Check magnesium today. Recent bmet reviewed.  Continue Eliquis 5 mg BID with no missed doses for 3 months post ablation.  Continue Lopressor 37.5 mg BID  2. Secondary Hypercoagulable State (ICD10:  D68.69) The patient is at significant risk for stroke/thromboembolism based upon his CHA2DS2-VASc Score of 2.  Continue Apixaban (Eliquis).   3. Obstructive sleep apnea Encouraged compliance with BiPap therapy. Followed by Dr Wynona Neat  4. SSS S/p PPM, followed by Dr Lalla Brothers and the device clinic.   Follow up with Dr Lalla Brothers as scheduled.    Jorja Loa PA-C Afib Clinic [facility] [address] [place], [place] [address] [phone] 01/25/2023 10:16 AM

## 2023-01-31 ENCOUNTER — Ambulatory Visit: Payer: Medicare Other | Admitting: Cardiology

## 2023-04-02 ENCOUNTER — Ambulatory Visit: Payer: Medicare Other

## 2023-04-02 DIAGNOSIS — I495 Sick sinus syndrome: Secondary | ICD-10-CM

## 2023-04-03 ENCOUNTER — Ambulatory Visit: Payer: Medicare Other | Admitting: Cardiology

## 2023-04-08 ENCOUNTER — Ambulatory Visit: Payer: Medicare Other | Attending: Cardiology | Admitting: Cardiology

## 2023-04-08 ENCOUNTER — Encounter: Payer: Self-pay | Admitting: Cardiology

## 2023-04-08 VITALS — BP 118/72 | HR 71 | Ht 72.0 in | Wt 172.0 lb

## 2023-04-08 DIAGNOSIS — I495 Sick sinus syndrome: Secondary | ICD-10-CM | POA: Diagnosis not present

## 2023-04-08 DIAGNOSIS — Z79899 Other long term (current) drug therapy: Secondary | ICD-10-CM | POA: Diagnosis not present

## 2023-04-08 DIAGNOSIS — I4819 Other persistent atrial fibrillation: Secondary | ICD-10-CM

## 2023-04-08 DIAGNOSIS — Z950 Presence of cardiac pacemaker: Secondary | ICD-10-CM | POA: Diagnosis not present

## 2023-04-08 LAB — CUP PACEART INCLINIC DEVICE CHECK
Date Time Interrogation Session: 20240805170833
Implantable Lead Connection Status: 753985
Implantable Lead Connection Status: 753985
Implantable Lead Implant Date: 20181212
Implantable Lead Implant Date: 20181212
Implantable Lead Location: 753859
Implantable Lead Location: 753860
Implantable Lead Model: 377
Implantable Lead Model: 377
Implantable Lead Serial Number: 80523187
Implantable Lead Serial Number: 80573594
Implantable Pulse Generator Implant Date: 20181212
Pulse Gen Model: 407145
Pulse Gen Serial Number: 69192108

## 2023-04-08 MED ORDER — METOPROLOL SUCCINATE ER 50 MG PO TB24: 50.0000 mg | ORAL_TABLET | Freq: Every day | ORAL | 3 refills | Status: DC

## 2023-04-08 NOTE — Patient Instructions (Signed)
Medication Instructions:  Your physician has recommended you make the following change in your medication:  1) STOP taking metoprolol tartrate  2) START taking metoprolol succinate 50 mg once daily *If you need a refill on your cardiac medications before your next appointment, please call your pharmacy*   Lab Work: TODAY: BMET and Mag If you have labs (blood work) drawn today and your tests are completely normal, you will receive your results only by: MyChart Message (if you have MyChart) OR A paper copy in the mail If you have any lab test that is abnormal or we need to change your treatment, we will call you to review the results.  Follow-Up: At [facility], you and your health needs are our priority.  As part of our continuing mission to provide you with exceptional heart care, we have created designated Provider Care Teams.  These Care Teams include your primary Cardiologist (physician) and Advanced Practice Providers (APPs -  Physician Assistants and Nurse Practitioners) who all work together to provide you with the care you need, when you need it.  Your next appointment:   4 month(s)  Provider:   Steffanie Dunn, MD

## 2023-04-08 NOTE — Progress Notes (Signed)
  Electrophysiology Office Follow up Visit Note:    Date:  04/08/2023   ID:  Antonio Curtis, DOB 08/28/1941, MRN [id number]  PCP:  [facility]  [facility] Cardiologist:  Rollene Rotunda, MD  [facility] Electrophysiologist:  Lanier Prude, MD    Interval History:    Antonio Curtis is a 82 y.o. male who presents for a follow up visit.   He had a third time A-fib ablation on November 26, 2022.  He has done well after the procedure without recurrence of arrhythmia.  He continues to take Eliquis for stroke prophylaxis and Tikosyn.  He intermittently feels fatigued.  He wonders whether or not this could be a medication side effect or related to his age.      Past medical, surgical, social and family history were reviewed.  ROS:   Please see the history of present illness.    All other systems reviewed and are negative.  EKGs/Labs/Other Studies Reviewed:    The following studies were reviewed today:  April 08, 2023 in clinic device interrogation personally reviewed Lead parameter stable Battery longevity 4 years 5 months 97% atrial pacing, 1% ventricular pacing 0% A-fib burden  EKG Interpretation Date/Time:  Monday April 08 2023 16:08:42 EDT Ventricular Rate:  71 PR Interval:  230 QRS Duration:  96 QT Interval:  406 QTC Calculation: 441 R Axis:   79  Text Interpretation: Atrial-paced rhythm with prolonged AV conduction Confirmed by Steffanie Dunn [phone]) on 04/08/2023 4:17:55 PM    Physical Exam:    VS:  BP 118/72   Pulse 71   Ht 6' (1.829 m)   Wt 172 lb (78 kg)   SpO2 97%   BMI 23.33 kg/m     Wt Readings from Last 3 Encounters:  04/08/23 172 lb (78 kg)  01/25/23 180 lb 6.4 oz (81.8 kg)  11/23/22 179 lb (81.2 kg)     GEN:  Well nourished, well developed in no acute distress CARDIAC: RRR, no murmurs, rubs, gallops.  Pacemaker pocket well-healed RESPIRATORY:  Clear to auscultation without rales, wheezing or rhonchi        ASSESSMENT:    1. Persistent atrial fibrillation (HCC)   2. Encounter for long-term (current) use of high-risk medication   3. Pacemaker   4. Sinus node dysfunction (HCC)    PLAN:    In order of problems listed above:  #Persistent atrial fibrillation and flutter #High risk med monitoring-Tikosyn The patient is doing well after his November 26, 2022 redo catheter ablation.  He is maintaining sinus rhythm based on his history and by pacemaker interrogation today. Continue Eliquis QTc acceptable for ongoing Tikosyn use Check BMP and magnesium for Tikosyn monitoring  #Sinus node dysfunction #Permanent pacemaker in situ Device functioning appropriately.  Continue remote monitoring.  Follow-up 4 months with me.   Signed, Steffanie Dunn, MD, [facility], [facility] 04/08/2023 5:00 PM    Electrophysiology [facility]

## 2023-04-21 ENCOUNTER — Other Ambulatory Visit: Payer: Self-pay | Admitting: Cardiology

## 2023-04-24 NOTE — Progress Notes (Signed)
Remote pacemaker transmission.   

## 2023-05-01 ENCOUNTER — Ambulatory Visit: Payer: Medicare Other | Admitting: Physician Assistant

## 2023-05-03 ENCOUNTER — Other Ambulatory Visit: Payer: Self-pay | Admitting: Internal Medicine

## 2023-05-03 ENCOUNTER — Ambulatory Visit
Admission: RE | Admit: 2023-05-03 | Discharge: 2023-05-03 | Disposition: A | Discharge status: Home / Self Care | Payer: Medicare Other | Source: Ambulatory Visit | Attending: Internal Medicine | Admitting: Internal Medicine

## 2023-05-03 DIAGNOSIS — M5430 Sciatica, unspecified side: Secondary | ICD-10-CM

## 2023-05-08 ENCOUNTER — Ambulatory Visit: Payer: Medicare Other | Admitting: Pulmonary Disease

## 2023-05-10 ENCOUNTER — Other Ambulatory Visit: Payer: Self-pay | Admitting: Cardiology

## 2023-05-10 DIAGNOSIS — I480 Paroxysmal atrial fibrillation: Secondary | ICD-10-CM

## 2023-05-10 NOTE — Telephone Encounter (Signed)
Prescription refill request for Eliquis received. Indication: Afib  Last office visit: 04/08/23 Lalla Brothers)  Scr: 1.15 (04/08/23)  Age: 82 Weight: 78kg  Appropriate dose. Refill sent.

## 2023-05-20 ENCOUNTER — Encounter: Payer: Self-pay | Admitting: Cardiology

## 2023-05-20 ENCOUNTER — Ambulatory Visit: Payer: Medicare Other | Attending: Cardiology | Admitting: Cardiology

## 2023-05-20 VITALS — BP 112/58 | HR 88 | Ht 72.0 in | Wt 180.0 lb

## 2023-05-20 DIAGNOSIS — I4819 Other persistent atrial fibrillation: Secondary | ICD-10-CM | POA: Diagnosis not present

## 2023-05-20 NOTE — Patient Instructions (Signed)
Medication Instructions:  NO CHANGES  *If you need a refill on your cardiac medications before your next appointment, please call your pharmacy*    Follow-Up: At [facility], you and your health needs are our priority.  As part of our continuing mission to provide you with exceptional heart care, we have created designated Provider Care Teams.  These Care Teams include your primary Cardiologist (physician) and Advanced Practice Providers (APPs -  Physician Assistants and Nurse Practitioners) who all work together to provide you with the care you need, when you need it.  We recommend signing up for the patient portal called "MyChart".  Sign up information is provided on this After Visit Summary.  MyChart is used to connect with patients for Virtual Visits (Telemedicine).  Patients are able to view lab/test results, encounter notes, upcoming appointments, etc.  Non-urgent messages can be sent to your provider as well.   To learn more about what you can do with MyChart, go to [email or web address].    Your next appointment:   Pending recommendations from Dr. Lalla Brothers for when next appointment with Dr. Antoine Poche will be

## 2023-05-20 NOTE — Progress Notes (Signed)
Cardiology Office Note:   Date:  05/20/2023  ID:  Antonio Curtis, DOB 1941/01/24, MRN [id number] PCP: [facility]  [facility] Providers Cardiologist:  Rollene Rotunda, MD Electrophysiologist:  Lanier Prude, MD  Sleep Medicine:  Armanda Magic, MD {  History of Present Illness:   Antonio Curtis is a 82 y.o. male He had a third time A-fib ablation on November 26, 2022.  He has had bradycardia and status post pacemaker.  He is also on Tikosyn.  He takes Eliquis for stroke prophylaxis.  He is actually done fairly well although he was a little lightheaded when he saw Dr. Lalla Brothers recently and had his metoprolol reduced.  He does have a lower blood pressure.  He said he felt better with this.  He was having more mild dizziness but this seems to have improved.  Any presyncope or syncope.  He had no chest pressure, neck or arm discomfort.  He takes care of his yard.  ROS: As stated in the HPI and negative for all other systems.  Studies Reviewed:    EKG:   NA  Risk Assessment/Calculations:    CHA2DS2-VASc Score = 2   This indicates a 2.2% annual risk of stroke. The patient's score is based upon: CHF History: 0 HTN History: 0 Diabetes History: 0 Stroke History: 0 Vascular Disease History: 0 Age Score: 2 Gender Score: 0       Physical Exam:   VS:  BP (!) 112/58 (BP Location: Left Arm, Patient Position: Sitting, Cuff Size: Normal)   Pulse 88   Ht 6' (1.829 m)   Wt 180 lb (81.6 kg)   BMI 24.41 kg/m    Wt Readings from Last 3 Encounters:  05/20/23 180 lb (81.6 kg)  04/08/23 172 lb (78 kg)  01/25/23 180 lb 6.4 oz (81.8 kg)     GEN: Well nourished, well developed in no acute distress NECK: No JVD; No carotid bruits CARDIAC: RRR, no murmurs, rubs, gallops RESPIRATORY:  Clear to auscultation without rales, wheezing or rhonchi  ABDOMEN: Soft, non-tender, non-distended EXTREMITIES:  No edema; No deformity   ASSESSMENT AND PLAN:   Persistent atrial  fibrillation and flutter : He seems to be doing well on Tikosyn and status post his third ablation.  He tolerates anticoagulation.  He has had his blood work and EKG measured recently.  He has follow-up in a few months again with EP.  I will defer any further follow-up to them unless they suggest otherwise.  He can come back and see me as needed.  Sinus node dysfunction: He is up to date with pacemaker follow-up.  No change in therapy.  Dizziness: This seems to have improved with reduced dose of beta-blocker.  I told him I be happy to hear from him via MyChart if he is still dizzy] reduce his beta-blocker further.  Follow-up with EP       Signed, Rollene Rotunda, MD

## 2023-05-28 ENCOUNTER — Ambulatory Visit: Payer: Medicare Other | Admitting: Pulmonary Disease

## 2023-05-28 ENCOUNTER — Encounter: Payer: Self-pay | Admitting: Pulmonary Disease

## 2023-05-28 VITALS — BP 120/64 | HR 69 | Temp 98.3°F | Ht 72.0 in | Wt 181.6 lb

## 2023-05-28 DIAGNOSIS — G4731 Primary central sleep apnea: Secondary | ICD-10-CM

## 2023-05-28 NOTE — Patient Instructions (Signed)
Continue using your BiPAP nightly  I will see you a year from now  The download from the machine shows it is working well  Sleeping with the head of the bed elevated may help with the air swallowing which leaves you bloated in the morning on some days Simethicone can help with gas  Call us with significant concerns

## 2023-05-28 NOTE — Progress Notes (Signed)
Antonio Curtis    [id number]    04-03-1941  Primary Care Physician:Pa, [facility]  Referring Physician: [facility] [address] [place],  [place] [address]  Chief complaint:   Patient with history of obstructive sleep apnea On BiPAP ST  HPI:  BiPAP was recently switched to BiPAP ST 14/10 with a backup rate of 16 Sleeping better  Overall feels better Still has multiple awakenings No necessarily having significant issues with the BiPAP  No issues with the pressure, feels the rate also is appropriate  Occasionally wakes up in the morning with some bloating  Not having significant issues with the BiPAP  Does not feel any significant issues with the pressure settings  Previous AHI on previous machine settings was an AHI of 26, compliance data currently shows an AHI of 14.4  Retitrated to BiPAP which he has been using regularly Was on BiPAP at 15/11, was having a lot of issues with the mask and pressures, his cheeks puff out during the night We empirically decreased the pressures to 14/9 -Had a retitration study to BiPAP ST which is what he is using at present  Issues with atrial fibrillation are better controlled -Has about 1-2 events a week -Previous history of ablation -Scheduled for ablation in March 2024  He does get short of breath on exertion, tries to stay active -He does feel limited whenever is A-fib is acting up  Has had multiple sleep studies, there were reviewed  History of snoring, no witnessed apneas Admits to daytime sleepiness Occasional dryness of his mouth in the mornings No morning headaches No night sweats Usually goes to bed about 11 PM, falls asleep easily, multiple awakenings, final wake up time about 6 AM  No family history of sleep apnea  He is active   Outpatient Encounter Medications as of 05/28/2023  Medication Sig   acetaminophen (TYLENOL) 500 MG tablet Take  1,000 mg by mouth as needed for moderate pain.   Cholecalciferol (VITAMIN D) 50 MCG (2000 UT) tablet Take 2,000 Units by mouth daily.   cyanocobalamin 2000 MCG tablet Take 2,000 mcg by mouth daily.   docusate sodium (COLACE) 50 MG capsule Take 50 mg by mouth daily.   dofetilide (TIKOSYN) 250 MCG capsule TAKE 1 CAPSULE BY MOUTH 2 TIMES DAILY.   ELIQUIS 5 MG TABS tablet TAKE 1 TABLET BY MOUTH TWICE A DAY   levothyroxine (SYNTHROID) 88 MCG tablet Take 88 mcg by mouth at bedtime.   metoprolol succinate (TOPROL-XL) 50 MG 24 hr tablet Take 1 tablet (50 mg total) by mouth daily. Take with or immediately following a meal.   No facility-administered encounter medications on file as of 05/28/2023.    Allergies as of 05/28/2023   (No Known Allergies)    Past Medical History:  Diagnosis Date   H/O partial resection of colon    Hypothyroid    Pacemaker    Biotronik Edora 8 DR-T   Paroxysmal atrial fibrillation (HCC)    Sick sinus syndrome (HCC)     Past Surgical History:  Procedure Laterality Date   ACHILLES TENDON REPAIR     APPENDECTOMY     ATRIAL FIBRILLATION ABLATION N/A 05/20/2020   Procedure: ATRIAL FIBRILLATION ABLATION;  Surgeon: [name], MD;  [facility];  Service: Cardiovascular;  Laterality: N/A;   ATRIAL FIBRILLATION ABLATION N/A 06/29/2021   Procedure: ATRIAL FIBRILLATION ABLATION;  Surgeon: [name], MD;  [facility];  Service: Cardiovascular;  Laterality: N/A;   ATRIAL FIBRILLATION ABLATION N/A 11/23/2022   Procedure: ATRIAL FIBRILLATION ABLATION;  Surgeon: Lanier Prude, MD;  [facility];  Service: Cardiovascular;  Laterality: N/A;   COLON SURGERY     Hemicolectomy:  Polyp.     PACEMAKER IMPLANT     Biotronik PPM implanted in [place] by Dr [name].   TONSILLECTOMY      Family History  Problem Relation Age of Onset   Heart disease Father 27   Diabetes Father     Social History   Socioeconomic History    Marital status: Married    Spouse name: Not on file   Number of children: Not on file   Years of education: Not on file   Highest education level: Not on file  Occupational History   Not on file  Tobacco Use   Smoking status: Never    Passive exposure: Past   Smokeless tobacco: Never   Tobacco comments:    Never smoke 11/21/21  Vaping Use   Vaping status: Never Used  Substance and Sexual Activity   Alcohol use: Yes    Alcohol/week: 1.0 - 2.0 standard drink of alcohol    Types: 1 - 2 Glasses of wine per week    Comment: monthly   Drug use: Never   Sexual activity: Not on file  Other Topics Concern   Not on file  Social History Narrative   Five children.  7 Grand.  Retired Education officer, community.   Lives in [place] with spouse.   Social Determinants of Health   Financial Resource Strain: Not on file  Food Insecurity: Not on file  Transportation Needs: Not on file  Physical Activity: Not on file  Stress: Not on file  Social Connections: Not on file  Intimate Partner Violence: Not on file    Review of Systems  Constitutional:  Negative for fatigue.  Respiratory:  Positive for apnea and shortness of breath.   Cardiovascular:  Negative for chest pain.  Psychiatric/Behavioral:  Positive for sleep disturbance.     Vitals:   05/28/23 1005  BP: 120/64  Pulse: 69  Temp: 98.3 F (36.8 C)  SpO2: 96%      Physical Exam Constitutional:      Appearance: Normal appearance.  HENT:     Head: Normocephalic.     Mouth/Throat:     Mouth: Mucous membranes are moist.  Eyes:     Pupils: Pupils are equal, round, and reactive to light.  Cardiovascular:     Rate and Rhythm: Normal rate and regular rhythm.     Heart sounds: No murmur heard.    No friction rub.  Pulmonary:     Effort: No respiratory distress.     Breath sounds: No stridor. No wheezing or rhonchi.  Musculoskeletal:     Cervical back: No rigidity or tenderness.  Neurological:     Mental Status: He is alert.   Psychiatric:        Mood and Affect: Mood normal.       08/14/2021    3:00 PM  Results of the Epworth flowsheet  Sitting and reading 3  Watching TV 2  Sitting, inactive in a public place (e.g. a theatre or a meeting) 0  As a passenger in a car for an hour without a break 3  Lying down to rest in the afternoon when circumstances permit 3  Sitting and talking to someone 0  Sitting quietly after a lunch without alcohol 2  In a car, while stopped for a few minutes in traffic 0  Total score 13     Data Reviewed: Sleep study from April 2021 shows moderate obstructive sleep apnea with mild oxygen desaturations  Titration study-titrated to BiPAP 12/8-9/25/2021 -Did not start BiPAP therapy  Retitration 09/24/2021 -Titrated to 15/11  Compliance shows 63% compliance BiPAP 14/10 with a backup rate of 16, previous AHI was 26.2  Titration 05/08/2022 -14/10 with backup rate of 16    Assessment:  History of moderate obstructive sleep apnea  Atrial fibrillation  Sleep quality is overall better  He is currently on BiPAP ST  Plan/Recommendations:  Continue BiPAP ST  Graded activities as tolerated  Encouraged to call with significant concerns  Follow-up in a year if he is not having any significant issues   Virl Diamond MD [facility] 05/28/2023, 10:20 AM  CC: Pa, [facility]

## 2023-07-02 ENCOUNTER — Ambulatory Visit (INDEPENDENT_AMBULATORY_CARE_PROVIDER_SITE_OTHER): Payer: Medicare Other

## 2023-07-02 DIAGNOSIS — I495 Sick sinus syndrome: Secondary | ICD-10-CM | POA: Diagnosis not present

## 2023-07-02 DIAGNOSIS — I4819 Other persistent atrial fibrillation: Secondary | ICD-10-CM

## 2023-07-03 LAB — CUP PACEART REMOTE DEVICE CHECK
Battery Remaining Percentage: 50 %
Brady Statistic RA Percent Paced: 93 %
Brady Statistic RV Percent Paced: 0 %
Date Time Interrogation Session: 20241029072926
Implantable Lead Connection Status: 753985
Implantable Lead Connection Status: 753985
Implantable Lead Implant Date: 20181212
Implantable Lead Implant Date: 20181212
Implantable Lead Location: 753859
Implantable Lead Location: 753860
Implantable Lead Model: 377
Implantable Lead Model: 377
Implantable Lead Serial Number: 80523187
Implantable Lead Serial Number: 80573594
Implantable Pulse Generator Implant Date: 20181212
Lead Channel Impedance Value: 546 Ohm
Lead Channel Impedance Value: 663 Ohm
Lead Channel Sensing Intrinsic Amplitude: 14.9 mV
Lead Channel Sensing Intrinsic Amplitude: 5.9 mV
Lead Channel Setting Pacing Amplitude: 2 V
Lead Channel Setting Pacing Amplitude: 2.4 V
Lead Channel Setting Pacing Pulse Width: 0.4 ms
Pulse Gen Model: 407145
Pulse Gen Serial Number: 69192108

## 2023-07-23 NOTE — Progress Notes (Signed)
Remote pacemaker transmission.   

## 2023-08-20 ENCOUNTER — Encounter: Payer: Medicare Other | Admitting: Cardiology

## 2023-09-17 NOTE — Progress Notes (Signed)
  Electrophysiology Office Follow up Visit Note:    Date:  09/18/2023   ID:  Antonio Curtis, DOB [birth year], MRN [id number]  PCP:  [facility]  [facility] Cardiologist:  Eilleen Grates, MD  [facility] Electrophysiologist:  Boyce Byes, MD    Interval History:     Antonio Curtis is a 83 y.o. male who presents for a follow up visit.  The patient had an A-fib ablation in March 2024 during which the veins and posterior wall were ablated. He has had a marked reduction in his atrial fibrillation burden after the catheter ablation with a burden less than 0.1% on the last remote pacemaker interrogation. He takes Tikosyn  and Eliquis  for his atrial fibrillation.  He is doing well today.  No sustained arrhythmias that he is aware of.  He reports some erectile dysfunction symptoms.  He is wondering whether or not the beta-blocker is contributing.      Past medical, surgical, social and family history were reviewed.  ROS:   Please see the history of present illness.    All other systems reviewed and are negative.  EKGs/Labs/Other Studies Reviewed:    The following studies were reviewed today:  September 18, 2023 in clinic device interrogation personally reviewed Battery longevity 4 years, 1 month Lead parameter stable 90% atrial pacing 0% ventricular pacing 0% A-fib burden        Physical Exam:    VS:  BP (!) 136/58 (BP Location: Left Arm, Patient Position: Sitting, Cuff Size: Large)   Pulse 73   Ht 6' (1.829 m)   Wt 181 lb (82.1 kg)   SpO2 98%   BMI 24.55 kg/m     Wt Readings from Last 3 Encounters:  09/18/23 181 lb (82.1 kg)  05/28/23 181 lb 9.6 oz (82.4 kg)  05/20/23 180 lb (81.6 kg)     GEN: no distress CARD: RRR, No MRG.  Pacemaker pocket well-healed RESP: No IWOB. CTAB.      ASSESSMENT:    1. Sinus node dysfunction (HCC)   2. Persistent atrial fibrillation (HCC)   3. Encounter for long-term (current) use of high-risk  medication   4. Pacemaker    PLAN:    In order of problems listed above:  #Persistent atrial fibrillation #High risk drug monitoring-Tikosyn  The patient is doing well after his catheter ablation and with use of Tikosyn .  QTc and creatinine remained stable for ongoing Tikosyn  use. Continue Eliquis  for stroke prophylaxis Reduced dose of metoprolol  to 25 mg by mouth once daily.  I have encouraged him to discuss ED treatment with his primary care physician. Check BMP and magnesium today.  #Permanent pacemaker in situ Device functioning appropriately, continue remote monitoring  Follow-up 4 months with EP APP.   Signed, Harvie Liner, MD, [facility], [facility] 09/18/2023 2:11 PM    Electrophysiology [facility]

## 2023-09-18 ENCOUNTER — Other Ambulatory Visit: Payer: Self-pay

## 2023-09-18 ENCOUNTER — Ambulatory Visit: Payer: Medicare Other | Attending: Cardiology | Admitting: Cardiology

## 2023-09-18 ENCOUNTER — Encounter: Payer: Self-pay | Admitting: Cardiology

## 2023-09-18 VITALS — BP 136/58 | HR 73 | Ht 72.0 in | Wt 181.0 lb

## 2023-09-18 DIAGNOSIS — Z950 Presence of cardiac pacemaker: Secondary | ICD-10-CM | POA: Diagnosis not present

## 2023-09-18 DIAGNOSIS — Z79899 Other long term (current) drug therapy: Secondary | ICD-10-CM | POA: Diagnosis not present

## 2023-09-18 DIAGNOSIS — I495 Sick sinus syndrome: Secondary | ICD-10-CM

## 2023-09-18 DIAGNOSIS — I4819 Other persistent atrial fibrillation: Secondary | ICD-10-CM | POA: Diagnosis not present

## 2023-09-18 MED ORDER — METOPROLOL SUCCINATE ER 25 MG PO TB24: 25.0000 mg | ORAL_TABLET | Freq: Every day | ORAL | 3 refills | Status: DC

## 2023-09-18 NOTE — Patient Instructions (Signed)
 Medication Instructions:  Your physician has recommended you make the following change in your medication: 1) DECREASE Toprol  XL (metoprolol  succinate) to 25 mg daily   *If you need a refill on your cardiac medications before your next appointment, please call your pharmacy*  Lab Work: TODAY: BMET and Mag  If you have labs (blood work) drawn today and your tests are completely normal, you will receive your results only by: MyChart Message (if you have MyChart) OR A paper copy in the mail If you have any lab test that is abnormal or we need to change your treatment, we will call you to review the results.   Follow-Up: At [facility], you and your health needs are our priority.  As part of our continuing mission to provide you with exceptional heart care, we have created designated Provider Care Teams.  These Care Teams include your primary Cardiologist (physician) and Advanced Practice Providers (APPs -  Physician Assistants and Nurse Practitioners) who all work together to provide you with the care you need, when you need it.   Your next appointment:   4 months  Provider:   You will see one of the following Advanced Practice Providers on your designated Care Team:   Mertha Abrahams, Kennard Pea [address] [place], PA-C Suzann Riddle, NP Creighton Doffing, NP

## 2023-09-19 LAB — BASIC METABOLIC PANEL
BUN/Creatinine Ratio: 16 (ref 10–24)
BUN: 19 mg/dL (ref 8–27)
CO2: 21 mmol/L (ref 20–29)
Calcium: 9.7 mg/dL (ref 8.6–10.2)
Chloride: 104 mmol/L (ref 96–106)
Creatinine, Ser: 1.17 mg/dL (ref 0.76–1.27)
Glucose: 110 mg/dL — ABNORMAL HIGH (ref 70–99)
Potassium: 5.1 mmol/L (ref 3.5–5.2)
Sodium: 142 mmol/L (ref 134–144)
eGFR: 63 mL/min/{1.73_m2} (ref >59)

## 2023-09-19 LAB — MAGNESIUM: Magnesium: 2.3 mg/dL (ref 1.6–2.3)

## 2023-10-01 ENCOUNTER — Ambulatory Visit (INDEPENDENT_AMBULATORY_CARE_PROVIDER_SITE_OTHER): Payer: Medicare Other

## 2023-10-01 DIAGNOSIS — I495 Sick sinus syndrome: Secondary | ICD-10-CM | POA: Diagnosis not present

## 2023-10-01 LAB — CUP PACEART REMOTE DEVICE CHECK
Date Time Interrogation Session: 20250128082559
Implantable Lead Connection Status: 753985
Implantable Lead Connection Status: 753985
Implantable Lead Implant Date: 20181212
Implantable Lead Implant Date: 20181212
Implantable Lead Location: 753859
Implantable Lead Location: 753860
Implantable Lead Model: 377
Implantable Lead Model: 377
Implantable Lead Serial Number: 80523187
Implantable Lead Serial Number: 80573594
Implantable Pulse Generator Implant Date: 20181212
Pulse Gen Model: 407145
Pulse Gen Serial Number: 69192108

## 2023-10-04 ENCOUNTER — Other Ambulatory Visit: Payer: Self-pay | Admitting: Cardiology

## 2023-10-04 DIAGNOSIS — I480 Paroxysmal atrial fibrillation: Secondary | ICD-10-CM

## 2023-10-04 NOTE — Telephone Encounter (Signed)
Eliquis 5mg  refill request received. Patient is 83 years old, weight-82.1kg, Crea-1.17 on 09/18/23, Diagnosis-Afib, and last seen by Dr. Lalla Brothers on 09/18/23. Dose is appropriate based on dosing criteria. Will send in refill to requested pharmacy.

## 2023-10-06 LAB — CUP PACEART INCLINIC DEVICE CHECK
Date Time Interrogation Session: 20250115163452
Implantable Lead Connection Status: 753985
Implantable Lead Connection Status: 753985
Implantable Lead Implant Date: 20181212
Implantable Lead Implant Date: 20181212
Implantable Lead Location: 753859
Implantable Lead Location: 753860
Implantable Lead Model: 377
Implantable Lead Model: 377
Implantable Lead Serial Number: 80523187
Implantable Lead Serial Number: 80573594
Implantable Pulse Generator Implant Date: 20181212
Pulse Gen Model: 407145
Pulse Gen Serial Number: 69192108

## 2023-11-11 NOTE — Progress Notes (Signed)
 Remote pacemaker transmission.

## 2023-12-31 ENCOUNTER — Ambulatory Visit (INDEPENDENT_AMBULATORY_CARE_PROVIDER_SITE_OTHER): Payer: Medicare Other

## 2023-12-31 DIAGNOSIS — I495 Sick sinus syndrome: Secondary | ICD-10-CM

## 2023-12-31 LAB — CUP PACEART REMOTE DEVICE CHECK
Battery Voltage: 45
Date Time Interrogation Session: 20250429083941
Implantable Lead Connection Status: 753985
Implantable Lead Connection Status: 753985
Implantable Lead Implant Date: 20181212
Implantable Lead Implant Date: 20181212
Implantable Lead Location: 753859
Implantable Lead Location: 753860
Implantable Lead Model: 377
Implantable Lead Model: 377
Implantable Lead Serial Number: 80523187
Implantable Lead Serial Number: 80573594
Implantable Pulse Generator Implant Date: 20181212
Pulse Gen Model: 407145
Pulse Gen Serial Number: 69192108

## 2024-01-06 ENCOUNTER — Ambulatory Visit: Payer: Medicare Other | Attending: Student | Admitting: Student

## 2024-01-06 ENCOUNTER — Encounter: Payer: Self-pay | Admitting: Student

## 2024-01-06 VITALS — BP 124/78 | HR 64 | Ht 72.0 in | Wt 180.8 lb

## 2024-01-06 DIAGNOSIS — I495 Sick sinus syndrome: Secondary | ICD-10-CM | POA: Diagnosis not present

## 2024-01-06 DIAGNOSIS — I4819 Other persistent atrial fibrillation: Secondary | ICD-10-CM

## 2024-01-06 LAB — CUP PACEART INCLINIC DEVICE CHECK
Battery Remaining Percentage: 45 %
Brady Statistic RA Percent Paced: 85 %
Brady Statistic RV Percent Paced: 0 %
Date Time Interrogation Session: 20250505104143
Implantable Lead Connection Status: 753985
Implantable Lead Connection Status: 753985
Implantable Lead Implant Date: 20181212
Implantable Lead Implant Date: 20181212
Implantable Lead Location: 753859
Implantable Lead Location: 753860
Implantable Lead Model: 377
Implantable Lead Model: 377
Implantable Lead Serial Number: 80523187
Implantable Lead Serial Number: 80573594
Implantable Pulse Generator Implant Date: 20181212
Lead Channel Impedance Value: 565 Ohm
Lead Channel Impedance Value: 702 Ohm
Lead Channel Pacing Threshold Amplitude: 0.8 V
Lead Channel Pacing Threshold Amplitude: 0.9 V
Lead Channel Pacing Threshold Pulse Width: 0.4 ms
Lead Channel Pacing Threshold Pulse Width: 0.4 ms
Lead Channel Setting Pacing Amplitude: 2 V
Lead Channel Setting Pacing Amplitude: 2.4 V
Lead Channel Setting Pacing Pulse Width: 0.4 ms
Pulse Gen Model: 407145
Pulse Gen Serial Number: 69192108

## 2024-01-06 NOTE — Progress Notes (Signed)
  Electrophysiology Office Note:   ID:  Cyris Lecher, Reinaldo Caras Jan 02, 1941, MRN [id number]  Primary Cardiologist: Eilleen Grates, MD Electrophysiologist: Boyce Byes, MD      History of Present Illness:   Antonio Curtis is a 83 y.o. male with h/o Persistent AF/AFL and SSS s/p PPM seen today for routine electrophysiology followup.   Since last being seen in our clinic the patient reports doing well. He had breakthrough AF on 4/29 while working in the yard, likely in the setting of taking Claritin-D the night before. Otherwise, he denies chest pain, palpitations, dyspnea, PND, orthopnea, nausea, vomiting, dizziness, syncope, edema, weight gain, or early satiety.   Review of systems complete and found to be negative unless listed in HPI.   EP Information / Studies Reviewed:    EKG is ordered today. Personal review as below.  EKG Interpretation Date/Time:  Monday Jan 06 2024 09:59:21 EDT Ventricular Rate:  68 PR Interval:  238 QRS Duration:  100 QT Interval:  412 QTC Calculation: 438 R Axis:   72  Text Interpretation: Atrial-paced rhythm with prolonged AV conduction Possible Lateral infarct , age undetermined Cannot rule out Inferior infarct , age undetermined When compared with ECG of 18-Sep-2023 13:55, Electronic atrial pacemaker has replaced Electronic ventricular pacemaker Confirmed by Pilar Bridge [phone]) on 01/06/2024 10:38:25 AM    PPM Interrogation-  reviewed in detail today,  See PACEART report.  Arrhythmia/Device History Biotronik Dual Chamber PPM implanted 2018 for SSS   Physical Exam:   VS:  BP 124/78   Pulse 64   Ht 6' (1.829 m)   Wt 180 lb 12.8 oz (82 kg)   SpO2 98%   BMI 24.52 kg/m    Wt Readings from Last 3 Encounters:  01/06/24 180 lb 12.8 oz (82 kg)  09/18/23 181 lb (82.1 kg)  05/28/23 181 lb 9.6 oz (82.4 kg)     GEN: No acute distress  NECK: No JVD; No carotid bruits CARDIAC: Regular rate and rhythm, no murmurs, rubs, gallops RESPIRATORY:  Clear  to auscultation without rales, wheezing or rhonchi  ABDOMEN: Soft, non-tender, non-distended EXTREMITIES:  No edema; No deformity   ASSESSMENT AND PLAN:    SND s/p Biotronik PPM  Normal PPM function See Pace Art report No changes today  Persistent AF/AFL EKG today shows stable intervals on Tikosyn  250 mcg BID Low burden by device, recent breakthrough likely in setting of decongestant Continue eliquis  5 mg BID for CHA2DS2/VASc of at least 3 Labs today    Disposition:   Follow up with EP APP in 6 months  Signed, Tylene Galla, PA-C

## 2024-01-06 NOTE — Patient Instructions (Signed)
 Medication Instructions:  Your physician recommends that you continue on your current medications as directed. Please refer to the Current Medication list given to you today.  *If you need a refill on your cardiac medications before your next appointment, please call your pharmacy*  Lab Work: BMET, MAG-TODAY If you have labs (blood work) drawn today and your tests are completely normal, you will receive your results only by: MyChart Message (if you have MyChart) OR A paper copy in the mail If you have any lab test that is abnormal or we need to change your treatment, we will call you to review the results.  Follow-Up: At [facility], you and your health needs are our priority.  As part of our continuing mission to provide you with exceptional heart care, our providers are all part of one team.  This team includes your primary Cardiologist (physician) and Advanced Practice Providers or APPs (Physician Assistants and Nurse Practitioners) who all work together to provide you with the care you need, when you need it.  Your next appointment:   6 month(s)  Provider:   Joycelyn Noa" Percell Boyers, PA-C    We recommend signing up for the patient portal called "MyChart".  Sign up information is provided on this After Visit Summary.  MyChart is used to connect with patients for Virtual Visits (Telemedicine).  Patients are able to view lab/test results, encounter notes, upcoming appointments, etc.  Non-urgent messages can be sent to your provider as well.   To learn more about what you can do with MyChart, go to [email or web address].

## 2024-01-07 LAB — MAGNESIUM: Magnesium: 2.3 mg/dL (ref 1.6–2.3)

## 2024-01-07 LAB — BASIC METABOLIC PANEL WITH GFR
BUN/Creatinine Ratio: 12 (ref 10–24)
BUN: 12 mg/dL (ref 8–27)
CO2: 22 mmol/L (ref 20–29)
Calcium: 9.5 mg/dL (ref 8.6–10.2)
Chloride: 100 mmol/L (ref 96–106)
Creatinine, Ser: 1.02 mg/dL (ref 0.76–1.27)
Glucose: 103 mg/dL — ABNORMAL HIGH (ref 70–99)
Potassium: 4.8 mmol/L (ref 3.5–5.2)
Sodium: 137 mmol/L (ref 134–144)
eGFR: 74 mL/min/{1.73_m2} (ref >59)

## 2024-01-08 ENCOUNTER — Encounter: Payer: Self-pay | Admitting: Cardiology

## 2024-02-14 NOTE — Progress Notes (Signed)
 Remote pacemaker transmission.

## 2024-03-31 ENCOUNTER — Ambulatory Visit: Payer: Medicare Other

## 2024-03-31 DIAGNOSIS — I495 Sick sinus syndrome: Secondary | ICD-10-CM

## 2024-04-01 LAB — CUP PACEART REMOTE DEVICE CHECK
Date Time Interrogation Session: 20250729093643
Implantable Lead Connection Status: 753985
Implantable Lead Connection Status: 753985
Implantable Lead Implant Date: 20181212
Implantable Lead Implant Date: 20181212
Implantable Lead Location: 753859
Implantable Lead Location: 753860
Implantable Lead Model: 377
Implantable Lead Model: 377
Implantable Lead Serial Number: 80523187
Implantable Lead Serial Number: 80573594
Implantable Pulse Generator Implant Date: 20181212
Pulse Gen Model: 407145
Pulse Gen Serial Number: 69192108

## 2024-04-02 ENCOUNTER — Ambulatory Visit: Payer: Self-pay | Admitting: Cardiology

## 2024-04-20 ENCOUNTER — Ambulatory Visit: Admitting: Family Medicine

## 2024-04-20 VITALS — BP 139/69 | Ht 72.0 in | Wt 180.0 lb

## 2024-04-20 DIAGNOSIS — M5416 Radiculopathy, lumbar region: Secondary | ICD-10-CM | POA: Diagnosis not present

## 2024-04-20 MED ORDER — PREGABALIN 25 MG PO CAPS
25.0000 mg | ORAL_CAPSULE | Freq: Two times a day (BID) | ORAL | 0 refills | Status: DC
Start: 2024-04-20 — End: 2024-06-26

## 2024-04-20 NOTE — Patient Instructions (Addendum)
 You have lumbar radiculopathy (a pinched nerve in your low back). Ok to take tylenol  for baseline pain relief (1-2 extra strength tabs 3x/day) Start aleve 2 tabs twice a day with food for pain and inflammation for 7 days then as needed. Lyrica  twice a day for nerve pain. Physical therapy has been shown to be helpful as well - start this. Strengthening of low back muscles, abdominal musculature are key for long term pain relief. If not improving, will consider further imaging (MRI). Follow up with me in 1 month.  [facility] in [place] [address], [address], [place], [place] [address] Phone: [phone]

## 2024-04-20 NOTE — Progress Notes (Unsigned)
 PCP: [facility]  Subjective:   HPI: Patient is a 83 y.o. male here for evaluation of right hip and lower leg pain.  Patient states that his right hip and lower leg pain have been present for the past 2 months and have progressively been worsening. He does not recall any sort of inciting event two months ago, however, he says he experienced something similar to this roughly a year ago and was told it was sciatica. He did exercises at the time and it resolved. He describes the pain as a burning and as though there are pins sticking in the area. He tried taking Tylenol  but had no relief os symptoms. Over the weekend, he reports his symptoms became very bad and he was barely able to get out of bed/move about the house. He has been taking Pregabalin  for the last few days and his lower leg pain is significantly improved.   Denies any recent history of fevers, cancer, bilateral lower extremity weakness, or bowel/bladder incontinence.   Past Medical History:  Diagnosis Date   H/O partial resection of colon    Hypothyroid    Pacemaker    Biotronik Edora 8 DR-T   Paroxysmal atrial fibrillation (HCC)    Sick sinus syndrome (HCC)     Current Outpatient Medications on File Prior to Visit  Medication Sig Dispense Refill   acetaminophen  (TYLENOL ) 500 MG tablet Take 1,000 mg by mouth as needed for moderate pain.     Cholecalciferol (VITAMIN D) 50 MCG (2000 UT) tablet Take 2,000 Units by mouth daily.     cyanocobalamin 2000 MCG tablet Take 2,000 mcg by mouth daily.     docusate sodium (COLACE) 50 MG capsule Take 50 mg by mouth daily.     dofetilide  (TIKOSYN ) 250 MCG capsule TAKE 1 CAPSULE BY MOUTH 2 TIMES DAILY. 180 capsule 3   ELIQUIS  5 MG TABS tablet TAKE 1 TABLET BY MOUTH TWICE A DAY 180 tablet 1   levothyroxine  (SYNTHROID ) 88 MCG tablet Take 88 mcg by mouth at bedtime.     metoprolol  succinate (TOPROL  XL) 25 MG 24 hr tablet Take 1 tablet (25 mg total) by mouth daily. 90  tablet 3   No current facility-administered medications on file prior to visit.    Past Surgical History:  Procedure Laterality Date   ACHILLES TENDON REPAIR     APPENDECTOMY     ATRIAL FIBRILLATION ABLATION N/A 05/20/2020   Procedure: ATRIAL FIBRILLATION ABLATION;  Surgeon: Kelsie Agent, MD;  [facility];  Service: Cardiovascular;  Laterality: N/A;   ATRIAL FIBRILLATION ABLATION N/A 06/29/2021   Procedure: ATRIAL FIBRILLATION ABLATION;  Surgeon: Kelsie Agent, MD;  [facility];  Service: Cardiovascular;  Laterality: N/A;   ATRIAL FIBRILLATION ABLATION N/A 11/23/2022   Procedure: ATRIAL FIBRILLATION ABLATION;  Surgeon: Cindie Ole DASEN, MD;  [facility];  Service: Cardiovascular;  Laterality: N/A;   COLON SURGERY     Hemicolectomy:  Polyp.     PACEMAKER IMPLANT     Biotronik PPM implanted in [place]  by Dr Peg.   TONSILLECTOMY      No Known Allergies  BP 139/69   Ht 6' (1.829 m)   Wt 180 lb (81.6 kg)   BMI 24.41 kg/m       No data to display              No data to display  Objective:  Physical Exam:  Gen: NAD, comfortable in exam room  MSK: Lower back and right lower extremity Inspection: No bony or soft tissue abnormalities appreciated. Right lower extremity appears symmetrical in comparison with left lower extremity.  Palpation: There is mild tenderness to palpation of the right gluteus maximus. No tenderness to palpation of the lumbar spine or paraspinal muscles.  ROM: FROM with hip flexion, knee extension/flexion, and plantarflexion/dorsiflexion.  Strength: 5/5 with knee extension/flexion, hip flexion, and plantar flexion. 5-/5 with dorsiflexion.  Neuro/Vasc: There is diminished sensation in along the lateral aspect of the lower leg and medial aspect of the foot (in an L5 distribution). Pedal pulses symmetric and intact bilaterally.  Special Tests: Negative Log roll, FABIR/FADER, and  Straight leg test.    Assessment & Plan:  1. Right hip and lower leg pain - Patient's right hip and lower leg pain are likely secondary to lumbar radiculopathy (L5) given the distribution of his symptoms, characteristic description of nerve pain, and relief of symptoms with Pregabalin . Discussed with patient that his exam findings are reassuring from the perspective that he does not need imaging. Therefore, discussed that we will treat with a combination of physical therapy and medications to help relief symptoms. If patient's symptoms fail to improve or start to worsen, may need to consider an MRI. Advised patient to follow-up in 1 month.   - Formal physical therapy  - Tylenol  as needed for pain up to 3x/day   - Aleve twice daily with food for 7 days  - Lyrica  twice daily for pain  - May consider MRI in the future if not improving  - Follow-up in 1 month  Signe Ravel, MS4 [facility]

## 2024-05-02 ENCOUNTER — Other Ambulatory Visit: Payer: Self-pay | Admitting: Cardiology

## 2024-05-02 DIAGNOSIS — I480 Paroxysmal atrial fibrillation: Secondary | ICD-10-CM

## 2024-05-05 NOTE — Telephone Encounter (Signed)
 Prescription refill request for Eliquis  received. Indication:afib Last office visit:5/25 Scr:1.02  5/25 Age: 83 Weight:81.6  kg  Prescription refilled

## 2024-05-18 ENCOUNTER — Encounter: Payer: Self-pay | Admitting: Family Medicine

## 2024-05-18 ENCOUNTER — Ambulatory Visit: Admitting: Family Medicine

## 2024-05-18 VITALS — BP 123/65 | Wt 180.0 lb

## 2024-05-18 DIAGNOSIS — M5416 Radiculopathy, lumbar region: Secondary | ICD-10-CM

## 2024-05-18 NOTE — Progress Notes (Signed)
 PCP: [facility]  Subjective:   HPI: Patient is a 83 y.o. male here for follow-up lumbar radiculopathy.  Hip and lower leg pain Feels 50-80 percent better with physical therapy. Still has lateral leg numbness on right Does have tender spots on hip still Still feels weakness/tightness in lower back on right side  Past Medical History:  Diagnosis Date   H/O partial resection of colon    Hypothyroid    Pacemaker    Biotronik [place] 8 DR-T   Paroxysmal atrial fibrillation (HCC)    Sick sinus syndrome (HCC)     Current Outpatient Medications on File Prior to Visit  Medication Sig Dispense Refill   acetaminophen  (TYLENOL ) 500 MG tablet Take 1,000 mg by mouth as needed for moderate pain.     Cholecalciferol (VITAMIN D) 50 MCG (2000 UT) tablet Take 2,000 Units by mouth daily.     cyanocobalamin 2000 MCG tablet Take 2,000 mcg by mouth daily.     docusate sodium (COLACE) 50 MG capsule Take 50 mg by mouth daily.     dofetilide  (TIKOSYN ) 250 MCG capsule TAKE 1 CAPSULE BY MOUTH 2 TIMES DAILY. 180 capsule 3   ELIQUIS  5 MG TABS tablet TAKE 1 TABLET BY MOUTH TWICE A DAY 180 tablet 1   levothyroxine  (SYNTHROID ) 88 MCG tablet Take 88 mcg by mouth at bedtime.     metoprolol  succinate (TOPROL  XL) 25 MG 24 hr tablet Take 1 tablet (25 mg total) by mouth daily. 90 tablet 3   pregabalin  (LYRICA ) 25 MG capsule Take 1 capsule (25 mg total) by mouth 2 (two) times daily. 60 capsule 0   No current facility-administered medications on file prior to visit.    Past Surgical History:  Procedure Laterality Date   ACHILLES TENDON REPAIR     APPENDECTOMY     ATRIAL FIBRILLATION ABLATION N/A 05/20/2020   Procedure: ATRIAL FIBRILLATION ABLATION;  Surgeon: Kelsie Agent, MD;  Location: [facility];  Service: Cardiovascular;  Laterality: N/A;   ATRIAL FIBRILLATION ABLATION N/A 06/29/2021   Procedure: ATRIAL FIBRILLATION ABLATION;  Surgeon: Kelsie Agent, MD;  Location: [facility];  Service: Cardiovascular;  Laterality: N/A;   ATRIAL FIBRILLATION ABLATION N/A 11/23/2022   Procedure: ATRIAL FIBRILLATION ABLATION;  Surgeon: Cindie Ole DASEN, MD;  Location: [facility];  Service: Cardiovascular;  Laterality: N/A;   COLON SURGERY     Hemicolectomy:  Polyp.     PACEMAKER IMPLANT     Biotronik PPM implanted in [place]  by Dr Peg.   TONSILLECTOMY      No Known Allergies  BP 123/65   Wt 180 lb (81.6 kg)   BMI 24.41 kg/m       No data to display              No data to display              Objective:  Physical Exam:  Gen: NAD, comfortable in exam room  Right leg/hip Inspection: No obvious deformity, erythema, swelling, ecchymoses  Palpation: Mild tenderness to palpation along the gluteus medius on the right, no overt tenderness at the right greater trochanter, no tenderness to palpation along the lumbar spinous processes, mild tenderness along the right lumbar paraspinal muscles ROM: Full hip ROM in flexion, extension, internal, external rotation. posterior lateral hip pain reproducible at extremes of internal/external rotation Special Tests: Negative FABER, FADIR, negative straight leg raise Strength: Strength 5/5 ankle plantar, dorsiflexion, knee flexion extension, hip flexion  extension, hip adduction, abduction Neurovascular: Numbness along the lateral aspect of the left leg    Assessment & Plan:   Assessment & Plan Lumbar radiculopathy Significant clinical improvement with physical therapy and Lyrica  1 month ago. Doubt component of greater trochanteric pain at this time. Symptoms still most consistent with lumbar radiculopathy.  Discussed continuing current treatment plan with patient. -Continue formal physical therapy -Continue Lyrica  25 mg twice daily -MRI if not improving, follow-up 1 month as needed  Ozell Provencal, MD, PGY-3 [facility] 10:34 AM 05/18/2024

## 2024-06-03 NOTE — Progress Notes (Signed)
 Remote PPM Transmission

## 2024-06-07 ENCOUNTER — Other Ambulatory Visit: Payer: Self-pay | Admitting: Cardiology

## 2024-06-24 ENCOUNTER — Other Ambulatory Visit: Payer: Self-pay | Admitting: Family Medicine

## 2024-06-30 ENCOUNTER — Ambulatory Visit (INDEPENDENT_AMBULATORY_CARE_PROVIDER_SITE_OTHER): Payer: Medicare Other

## 2024-06-30 DIAGNOSIS — I4819 Other persistent atrial fibrillation: Secondary | ICD-10-CM

## 2024-07-01 LAB — CUP PACEART REMOTE DEVICE CHECK
Date Time Interrogation Session: 20251029090947
Implantable Lead Connection Status: 753985
Implantable Lead Connection Status: 753985
Implantable Lead Implant Date: 20181212
Implantable Lead Implant Date: 20181212
Implantable Lead Location: 753859
Implantable Lead Location: 753860
Implantable Lead Model: 377
Implantable Lead Model: 377
Implantable Lead Serial Number: 80523187
Implantable Lead Serial Number: 80573594
Implantable Pulse Generator Implant Date: 20181212
Pulse Gen Model: 407145
Pulse Gen Serial Number: 69192108

## 2024-07-02 ENCOUNTER — Ambulatory Visit: Payer: Self-pay | Admitting: Cardiology

## 2024-07-07 NOTE — Progress Notes (Signed)
 Remote PPM Transmission

## 2024-08-08 ENCOUNTER — Other Ambulatory Visit: Payer: Self-pay | Admitting: Cardiology

## 2024-09-01 ENCOUNTER — Other Ambulatory Visit: Payer: Self-pay | Admitting: Family Medicine

## 2024-09-04 ENCOUNTER — Other Ambulatory Visit: Payer: Self-pay | Admitting: Cardiology

## 2024-09-29 ENCOUNTER — Ambulatory Visit

## 2024-09-29 DIAGNOSIS — I495 Sick sinus syndrome: Secondary | ICD-10-CM

## 2024-09-30 LAB — CUP PACEART REMOTE DEVICE CHECK
Battery Voltage: 40
Date Time Interrogation Session: 20260127080709
Implantable Lead Connection Status: 753985
Implantable Lead Connection Status: 753985
Implantable Lead Implant Date: 20181212
Implantable Lead Implant Date: 20181212
Implantable Lead Location: 753859
Implantable Lead Location: 753860
Implantable Lead Model: 377
Implantable Lead Model: 377
Implantable Lead Serial Number: 80523187
Implantable Lead Serial Number: 80573594
Implantable Pulse Generator Implant Date: 20181212
Pulse Gen Model: 407145
Pulse Gen Serial Number: 69192108

## 2024-10-04 ENCOUNTER — Ambulatory Visit: Payer: Self-pay | Admitting: Cardiology

## 2024-10-08 NOTE — Progress Notes (Signed)
 Remote PPM Transmission

## 2024-12-29 ENCOUNTER — Ambulatory Visit

## 2025-03-30 ENCOUNTER — Ambulatory Visit

## 2025-06-29 ENCOUNTER — Ambulatory Visit

## 2025-09-28 ENCOUNTER — Ambulatory Visit

## 2025-12-28 ENCOUNTER — Ambulatory Visit
